# Patient Record
Sex: Male | Born: 1995 | Race: White | Hispanic: No | Marital: Single | Smoking: Current every day smoker
Health system: Southern US, Community
[De-identification: ages and names within clinical notes are randomized; demographics above are authoritative.]

## PROBLEM LIST (undated history)

## (undated) DIAGNOSIS — F988 Other specified behavioral and emotional disorders with onset usually occurring in childhood and adolescence: Secondary | ICD-10-CM

## (undated) HISTORY — DX: Other specified behavioral and emotional disorders with onset usually occurring in childhood and adolescence: F98.8

---
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
## 2014-04-28 ENCOUNTER — Emergency Department (HOSPITAL_COMMUNITY)
Admission: EM | Admit: 2014-04-28 | Discharge: 2014-04-28 | Disposition: A | Discharge status: Home / Self Care | Payer: Managed Care, Other (non HMO) | Attending: Emergency Medicine | Admitting: Emergency Medicine

## 2014-04-28 ENCOUNTER — Encounter (HOSPITAL_COMMUNITY): Payer: Self-pay | Admitting: Emergency Medicine

## 2014-04-28 ENCOUNTER — Ambulatory Visit (HOSPITAL_COMMUNITY): Admission: RE | Admit: 2014-04-28 | Payer: 59 | Source: Home / Self Care | Admitting: Psychiatry

## 2014-04-28 DIAGNOSIS — Z720 Tobacco use: Secondary | ICD-10-CM | POA: Insufficient documentation

## 2014-04-28 DIAGNOSIS — Z008 Encounter for other general examination: Secondary | ICD-10-CM | POA: Diagnosis present

## 2014-04-28 DIAGNOSIS — G479 Sleep disorder, unspecified: Secondary | ICD-10-CM | POA: Diagnosis not present

## 2014-04-28 DIAGNOSIS — F1120 Opioid dependence, uncomplicated: Secondary | ICD-10-CM | POA: Insufficient documentation

## 2014-04-28 LAB — CBC WITH DIFFERENTIAL/PLATELET
Basophils Absolute: 0 10*3/uL (ref 0.0–0.1)
Basophils Relative: 0 % (ref 0–1)
Eosinophils Absolute: 0.1 10*3/uL (ref 0.0–0.7)
Eosinophils Relative: 1 % (ref 0–5)
HCT: 44 % (ref 39.0–52.0)
Hemoglobin: 15.1 g/dL (ref 13.0–17.0)
Lymphocytes Relative: 16 % (ref 12–46)
Lymphs Abs: 2 10*3/uL (ref 0.7–4.0)
MCH: 29.7 pg (ref 26.0–34.0)
MCHC: 34.3 g/dL (ref 30.0–36.0)
MCV: 86.4 fL (ref 78.0–100.0)
Monocytes Absolute: 0.8 10*3/uL (ref 0.1–1.0)
Monocytes Relative: 6 % (ref 3–12)
Neutro Abs: 9.5 10*3/uL — ABNORMAL HIGH (ref 1.7–7.7)
Neutrophils Relative %: 77 % (ref 43–77)
Platelets: 258 10*3/uL (ref 150–400)
RBC: 5.09 MIL/uL (ref 4.22–5.81)
RDW: 12.4 % (ref 11.5–15.5)
WBC: 12.4 10*3/uL — ABNORMAL HIGH (ref 4.0–10.5)

## 2014-04-28 LAB — ETHANOL: Alcohol, Ethyl (B): <11 mg/dL (ref 0–11)

## 2014-04-28 LAB — COMPREHENSIVE METABOLIC PANEL
ALT: 13 U/L (ref 0–53)
AST: 14 U/L (ref 0–37)
Albumin: 4.5 g/dL (ref 3.5–5.2)
Alkaline Phosphatase: 64 U/L (ref 39–117)
Anion gap: 14 (ref 5–15)
BUN: 10 mg/dL (ref 6–23)
CO2: 27 mEq/L (ref 19–32)
Calcium: 9.7 mg/dL (ref 8.4–10.5)
Chloride: 101 mEq/L (ref 96–112)
Creatinine, Ser: 1.01 mg/dL (ref 0.50–1.35)
GFR calc Af Amer: >90 mL/min (ref >90)
GFR calc non Af Amer: >90 mL/min (ref >90)
Glucose, Bld: 104 mg/dL — ABNORMAL HIGH (ref 70–99)
Potassium: 4.4 mEq/L (ref 3.7–5.3)
Sodium: 142 mEq/L (ref 137–147)
Total Bilirubin: 0.6 mg/dL (ref 0.3–1.2)
Total Protein: 7.5 g/dL (ref 6.0–8.3)

## 2014-04-28 LAB — RAPID URINE DRUG SCREEN, HOSP PERFORMED
Amphetamines: NOT DETECTED
Barbiturates: NOT DETECTED
Benzodiazepines: NOT DETECTED
Cocaine: NOT DETECTED
Opiates: NOT DETECTED
Tetrahydrocannabinol: NOT DETECTED

## 2014-04-28 NOTE — ED Notes (Signed)
Per pt/mother binging on opiates since Tuesday, here for medical clearance

## 2014-04-28 NOTE — ED Provider Notes (Signed)
CSN: [id number]     Arrival date & time 04/28/14  1326 History  This chart was scribed for non-physician practitioner working with Dr. Anitra LauthPlunkett by Elveria Risingimelie Horne, ED Scribe. This patient was seen in room WTR4/WLPT4 and the patient's care was started at 2:09 PM.   Chief Complaint  Patient presents with  . opiate addiction    The history is provided by the patient and a parent. No language interpreter was used.   HPI Comments: Elijah [name] is a [age] y.o. male who presents to the Emergency Department seeking help for his opiate addiction. [facility].  Patient reports taking an array of medications including, "DM pills" and five Tramadol three nights ago. Patient denies any desire to harm himself; he was just seeking a high. Patient denies alcohol use. Patient states that he has not used since the incident. Mother is concerned that the patient has not slept since taking the pills.  Patient has never been to rehab. Patient denies psychiatric history.   History reviewed. No pertinent past medical history. History reviewed. No pertinent past surgical history. No family history on file. History  Substance Use Topics  . Smoking status: Current Every Day Smoker  . Smokeless tobacco: Not on file  . Alcohol Use: No    Review of Systems  Constitutional: Negative for fever and chills.  Cardiovascular: Negative for chest pain.  Gastrointestinal: Negative for nausea, vomiting and diarrhea.  Psychiatric/Behavioral: Positive for sleep disturbance. Negative for suicidal ideas and self-injury.    Allergies  Review of patient's allergies indicates not on file.  Home Medications   Prior to Admission medications   Not on File   Triage Vitals: BP 103/73  Pulse 96  Temp(Src) 98.3 F (36.8 C) (Oral)  Resp 16  SpO2 100%  Physical Exam  Nursing note and vitals reviewed. Constitutional: He is oriented to person, place, and time. He appears  well-developed and well-nourished. No distress.  HENT:  Head: Normocephalic and atraumatic.  Eyes: EOM are normal.  Neck: Neck supple.  Cardiovascular: Normal rate.   Pulmonary/Chest: Effort normal. No respiratory distress.  Musculoskeletal: Normal range of motion.  Neurological: He is alert and oriented to person, place, and time.  Skin: Skin is warm and dry.  Psychiatric: He has a normal mood and affect. His speech is normal. He expresses no homicidal and no suicidal ideation.    ED Course  Procedures (including critical care time)  COORDINATION OF CARE: 2:18 PM- Discussed treatment plan with patient at bedside and patient agreed to plan.   Labs Review Labs Reviewed  CBC WITH DIFFERENTIAL - Abnormal; Notable for the following:    WBC 12.4 (*)    Neutro Abs 9.5 (*)    All other components within normal limits  COMPREHENSIVE METABOLIC PANEL - Abnormal; Notable for the following:    Glucose, Bld 104 (*)    All other components within normal limits  URINE RAPID DRUG SCREEN (HOSP PERFORMED)  ETHANOL    Imaging Review No results found.   EKG Interpretation None      MDM   Final diagnoses:  Encounter for medical clearance for patient hold  Uncomplicated opioid dependence   [facility] options.   I personally performed the services described in this documentation, which was scribed in my presence. The recorded information has been reviewed and is accurate.    Junius FinnerErin O'Malley, PA-C 04/28/14 2021

## 2014-04-28 NOTE — Discharge Instructions (Signed)
Finding Treatment for Alcohol and Drug Addiction It can be hard to find the right place to get professional treatment. Here are some important things to consider:  There are different types of treatment to choose from.  Some programs are live-in (residential) while others are not (outpatient). Sometimes a combination is offered.  No single type of program is right for everyone.  Most treatment programs involve a combination of education, counseling, and a 12-step, spiritually-based approach.  There are non-spiritually based programs (not 12-step).  Some treatment programs are government sponsored. They are geared for patients without private insurance.  Treatment programs can vary in many respects such as:  Cost and types of insurance accepted.  Types of on-site medical services offered.  Length of stay, setting, and size.  Overall philosophy of treatment. A person may need specialized treatment or have needs not addressed by all programs. For example, adolescents need treatment appropriate for their age. Other people have secondary disorders that must be managed as well. Secondary conditions can include mental illness, such as depression or diabetes. Often, a period of detoxification from alcohol or drugs is needed. This requires medical supervision and not all programs offer this. THINGS TO CONSIDER WHEN SELECTING A TREATMENT PROGRAM   Is the program certified by the appropriate government agency? Even private programs must be certified and employ certified professionals.  Does the program accept your insurance? If not, can a payment plan be set up?  Is the facility clean, organized, and well run? Do they allow you to speak with graduates who can share their treatment experience with you? Can you tour the facility? Can you meet with staff?  Does the program meet the full range of individual needs?  Does the treatment program address sexual orientation and physical disabilities?  Do they provide age, gender, and culturally appropriate treatment services?  Is treatment available in languages other than English?  Is long-term aftercare support or guidance encouraged and provided?  Is assessment of an individual's treatment plan ongoing to ensure it meets changing needs?  Does the program use strategies to encourage reluctant patients to remain in treatment long enough to increase the likelihood of success?  Does the program offer counseling (individual or group) and other behavioral therapies?  Does the program offer medicine as part of the treatment regimen, if needed?  Is there ongoing monitoring of possible relapse? Is there a defined relapse prevention program? Are services or referrals offered to family members to ensure they understand addiction and the recovery process? This would help them support the recovering individual.  Are 12-step meetings held at the center or is transport available for patients to attend outside meetings? In countries outside of the [place], see local directories for contact information for services in your area. Document Released: 06/06/2005 Document Revised: 09/30/2011 Document Reviewed: 12/17/2007 ExitCare Patient Information 2015 [employer]. This information is not intended to replace advice given to you by your health care provider. Make sure you discuss any questions you have with your health care provider.  

## 2014-04-29 ENCOUNTER — Ambulatory Visit (HOSPITAL_COMMUNITY)
Admission: RE | Admit: 2014-04-29 | Discharge: 2014-04-29 | Disposition: A | Discharge status: Home / Self Care | Payer: Managed Care, Other (non HMO) | Attending: Psychiatry | Admitting: Psychiatry

## 2014-04-29 ENCOUNTER — Encounter (HOSPITAL_COMMUNITY): Payer: Self-pay | Admitting: *Deleted

## 2014-04-29 DIAGNOSIS — Z008 Encounter for other general examination: Secondary | ICD-10-CM | POA: Insufficient documentation

## 2014-04-29 DIAGNOSIS — Z609 Problem related to social environment, unspecified: Secondary | ICD-10-CM | POA: Insufficient documentation

## 2014-04-29 DIAGNOSIS — Z818 Family history of other mental and behavioral disorders: Secondary | ICD-10-CM | POA: Insufficient documentation

## 2014-04-29 DIAGNOSIS — Z569 Unspecified problems related to employment: Secondary | ICD-10-CM | POA: Diagnosis not present

## 2014-04-29 DIAGNOSIS — F331 Major depressive disorder, recurrent, moderate: Secondary | ICD-10-CM | POA: Insufficient documentation

## 2014-04-29 NOTE — ED Provider Notes (Signed)
Medical screening examination/treatment/procedure(s) were performed by non-physician practitioner and as supervising physician I was immediately available for consultation/collaboration.   EKG Interpretation None        Gwyneth SproutWhitney Velmer Woelfel, MD 04/29/14 1247

## 2014-04-29 NOTE — BH Assessment (Signed)
Assessment Note  Elijah [name] W Elijah [name] is an 18 y.o. male that was referred by his mother and counselor Burlene Arnt(Jennifer Bessler) for evaluation.  [facility] with his mother and girlfriend and they were both present for assessment.  Per mother, pt's future counselor, Burlene ArntJennifer Bessler, wanted pt to have medical clearance and an [facility] before pt could be seen by her for counseling to determine appropriate level of care.  [facility] and [facility].  Pt took 32 Triple C pills, 5 Tramadol, and one muscle relaxer Tuesday night and this worried mother, so she called Ms. Bessler once she found out.  Pt stated he took this medicine with his friend at his friend's grandmother's house to "get high," and that the grandmother "watched us so we wouldn't fall asleep," but was not taken to ER.  Pt denies this was an attempt to harm himself, denies SI currently, and denies any self-harm in the past.  Pt denies HI or psychosis.  Pt admits to taking some of his mother's Clonazepam (taking 2 for sleep) and one Trazadone of his mother's for sleep in the past 2 weeks.  Pt stated he takes medication for sleep.  Pt endorses sx of depression and anxiety and reports it is hard to get up in the mornings sometimes.  He works, lives with his mother and feels like a "failure" for not going to college as planned.  Her reports he feels "emotionless" or "apathetic" most of the time and doesn't like feeling this way.  He does admit to feeling "sad" or "down" at times as well.  Pt got in trouble with his friends for breaking and entering and stealing last year and is on probation.  He has a hx of diagnosis of ADD and is prescribed Adderal by his PCP.  He doesn't take this consistently and sometimes reports taking more than prescribed.  Pt is cooperative, has depressed mood, appears anxious, has good eye contact, is oriented x 4, has logical/coherent thought processes  and normal sleech.  Pt doesn't meet criteria for inpatient psychiatric stablization at this time.  Consulted with May Agustin, NP @ 1350 who was in agreement and pt stated he was going to follow up with a counselor and make a psychiatry appt with [facility].  Updated TTS staff and Berneice Heinrichina Tate, [facility].  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 Axis IV: occupational problems, other psychosocial or environmental problems, problems related to legal system/crime and problems with primary support group Axis V: 51-60 moderate symptoms  Past Medical History: No past medical history on file.  No past surgical history on file.  Family History: No family history on file.  Social History:  reports that he has been smoking.  He does not have any smokeless tobacco history on file. He reports that he uses illicit drugs. He reports that he does not drink alcohol.  Additional Social History:  Alcohol / Drug Use Pain Medications: see med list Prescriptions: see med list Over the Counter: see med list History of alcohol / drug use?:  (pt has experimented with several drugs) Longest period of sobriety (when/how long):  (unknown) Withdrawal Symptoms:  (na)  CIWA:   COWS:    Allergies: Allergies not on file  Home Medications:  (Not in a hospital admission)  OB/GYN Status:  No LMP for male patient.  General  Assessment Data [facility] of Assessment: [facility] Services Is this a Tele or Face-to-Face Assessment?: Face-to-Face Is this an Initial Assessment or a Re-assessment for this encounter?: Initial Assessment Living Arrangements: Parent Can pt return to current living arrangement?: No Admission Status: Voluntary Is patient capable of signing voluntary admission?: Yes Transfer from: Home Referral Source: Other  Medical Screening Exam [facility] Walk-in ONLY) Medical Exam completed: No Reason for MSE not completed:  Patient Refused  [facility] Crisis Care Plan Living Arrangements: Parent Name of Psychiatrist: none Name of Therapist: none  Education Status Is patient currently in school?: No Highest grade of school patient has completed: High School graduate  Risk to self with the past 6 months Suicidal Ideation: No Suicidal Intent: No Is patient at risk for suicide?: No Suicidal Plan?: No Access to Means: No What has been your use of drugs/alcohol within the last 12 months?: pt admits to experimental drug use Previous Attempts/Gestures: No How many times?: 0 Other Self Harm Risks: na - pt denies Triggers for Past Attempts: None known Intentional Self Injurious Behavior: None Family Suicide History: Yes (sister attempted suicide) Recent stressful life event(s): Legal Issues;Other (Comment) (SA, legal, depression) Persecutory voices/beliefs?: No Depression: Yes Depression Symptoms: Despondent;Insomnia;Isolating;Fatigue;Guilt;Loss of interest in usual pleasures;Feeling worthless/self pity Substance abuse history and/or treatment for substance abuse?: Yes Suicide prevention information given to non-admitted patients: Not applicable  Risk to Others within the past 6 months Homicidal Ideation: No Thoughts of Harm to Others: No Current Homicidal Intent: No Current Homicidal Plan: No Access to Homicidal Means: No Identified Victim: na - pt denies History of harm to others?: No Assessment of Violence: None Noted Violent Behavior Description: na - pt denies Does patient have access to weapons?: No Criminal Charges Pending?: No Does patient have a court date: No (pt is on probation)  Psychosis Hallucinations: None noted Delusions: None noted  Mental Status Report Appear/Hygiene: Unremarkable (in street clothes, appears stated age) Eye Contact: Good Motor Activity: Restlessness Speech: Logical/coherent Level of Consciousness: Alert Mood: Depressed;Anxious Affect: Depressed;Anxious Anxiety  Level: Moderate Thought Processes: Coherent;Relevant Judgement: Unimpaired Orientation: Person;Place;Time;Situation;Appropriate for developmental age Obsessive Compulsive Thoughts/Behaviors: None  Cognitive Functioning Concentration: Decreased Memory: Recent Intact;Remote Intact IQ: Average Insight: Fair Impulse Control: Poor Appetite: Good Weight Loss: 0 Weight Gain: 0 Sleep: Decreased Total Hours of Sleep:  (varies) Vegetative Symptoms: None  ADLScreening [facility] Assessment Services) Patient's cognitive ability adequate to safely complete daily activities?: Yes Patient able to express need for assistance with ADLs?: Yes Independently performs ADLs?: Yes (appropriate for developmental age)  Prior Inpatient Therapy Prior Inpatient Therapy: No Prior Therapy Dates: na Prior Therapy Facilty/Provider(s): na Reason for Treatment: na  Prior Outpatient Therapy Prior Outpatient Therapy: No Prior Therapy Dates: na Prior Therapy Facilty/Provider(s): na Reason for Treatment: na  ADL Screening (condition at time of admission) Patient's cognitive ability adequate to safely complete daily activities?: Yes Is the patient deaf or have difficulty hearing?: No Does the patient have difficulty seeing, even when wearing glasses/contacts?: No Does the patient have difficulty concentrating, remembering, or making decisions?: No Patient able to express need for assistance with ADLs?: Yes Does the patient have difficulty dressing or bathing?: No Independently performs ADLs?: Yes (appropriate for developmental age) Does the patient have difficulty walking or climbing stairs?: No  Home Assistive Devices/Equipment Home Assistive Devices/Equipment: None    Abuse/Neglect Assessment (Assessment to be complete while patient is alone) Physical Abuse: Denies Verbal Abuse: Denies Sexual Abuse: Denies Exploitation of patient/patient's resources: Denies Self-Neglect: Denies Values /  Beliefs  Cultural Requests During Hospitalization: None Spiritual Requests During Hospitalization: None Consults Spiritual Care Consult Needed: No Social Work Consult Needed: No Merchant navy officerAdvance Directives (For Healthcare) Does patient have an advance directive?: No Would patient like information on creating an advanced directive?: No - patient declined information    Additional Information 1:1 In Past 12 Months?: No CIRT Risk: No Elopement Risk: No Does patient have medical clearance?: No     Disposition:  Disposition Initial Assessment Completed for this Encounter: Yes Disposition of Patient: Referred to;Outpatient treatment Type of outpatient treatment: Adult  On Site Evaluation by:   Reviewed with Physician:    Casimer LaniusKristen Alise Calais, MS, [facility] Licensed Professional Counselor Therapeutic Triage Specialist [facility] Phone: [phone] Fax: [phone]  04/29/2014 2:33 PM

## 2015-05-16 ENCOUNTER — Ambulatory Visit (INDEPENDENT_AMBULATORY_CARE_PROVIDER_SITE_OTHER): Payer: Managed Care, Other (non HMO) | Admitting: Emergency Medicine

## 2015-05-16 ENCOUNTER — Ambulatory Visit: Payer: Self-pay

## 2015-05-16 VITALS — BP 108/68 | HR 72 | Temp 98.5°F | Resp 16 | Ht 71.25 in | Wt 158.4 lb

## 2015-05-16 DIAGNOSIS — Z23 Encounter for immunization: Secondary | ICD-10-CM | POA: Diagnosis not present

## 2015-05-16 NOTE — Progress Notes (Signed)
   Subjective:  Patient ID: Elijah [name], male    DOB: 09/09/1995  Age: 19 y.o. MRN: [id number]  CC: Immunizations   HPI Elijah [name] presents  requesting a tetanus shot. He is concerned about his occupational exposure to infection.  History Elijah [name] has a past medical history of ADD (attention deficit disorder).   He has no past surgical history on file.   His  family history includes Heart disease in his maternal grandfather; Psoriasis in his sister.  He   reports that he has been smoking.  He does not have any smokeless tobacco history on file. He reports that he does not drink alcohol or use illicit drugs.  No outpatient prescriptions prior to visit.   No facility-administered medications prior to visit.    Social History   Social History  . Marital Status: Single    Spouse Name: N/A  . Number of Children: N/A  . Years of Education: N/A   Social History Main Topics  . Smoking status: Current Every Day Smoker  . Smokeless tobacco: None  . Alcohol Use: No  . Drug Use: No  . Sexual Activity: Yes   Other Topics Concern  . None   Social History Narrative     Review of Systems  Constitutional: Negative for fever, chills and appetite change.  HENT: Negative for congestion, ear pain, postnasal drip, sinus pressure and sore throat.   Eyes: Negative for pain and redness.  Respiratory: Negative for cough, shortness of breath and wheezing.   Cardiovascular: Negative for leg swelling.  Gastrointestinal: Negative for nausea, vomiting, abdominal pain, diarrhea, constipation and blood in stool.  Endocrine: Negative for polyuria.  Genitourinary: Negative for dysuria, urgency, frequency and flank pain.  Musculoskeletal: Negative for gait problem.  Skin: Negative for rash.  Neurological: Negative for weakness and headaches.  Psychiatric/Behavioral: Negative for confusion and decreased concentration. The patient is not nervous/anxious.     Objective:  BP 108/68 mmHg   Pulse 72  Temp(Src) 98.5 F (36.9 C) (Oral)  Resp 16  Ht 5' 11.25" (1.81 m)  Wt 158 lb 6.4 oz (71.85 kg)  BMI 21.93 kg/m2  SpO2 99%  Physical Exam  Constitutional: He is oriented to person, place, and time. He appears well-developed and well-nourished.  HENT:  Head: Normocephalic and atraumatic.  Eyes: Conjunctivae are normal. Pupils are equal, round, and reactive to light.  Pulmonary/Chest: Effort normal.  Musculoskeletal: He exhibits no edema.  Neurological: He is alert and oriented to person, place, and time.  Skin: Skin is dry.  Psychiatric: He has a normal mood and affect. His behavior is normal. Thought content normal.      Assessment & Plan:   Elijah [name] was seen today for immunizations.  Diagnoses and all orders for this visit:  Need for Tdap vaccination  Other orders -     Tdap vaccine greater than or equal to 7yo IM   I am having Elijah [name] maintain his amphetamine-dextroamphetamine and amphetamine-dextroamphetamine.  Meds ordered this encounter  Medications  . amphetamine-dextroamphetamine (ADDERALL) 10 MG tablet    Sig: Take 10 mg by mouth PC lunch.  . amphetamine-dextroamphetamine (ADDERALL XR) 30 MG 24 hr capsule    Sig: Take 30 mg by mouth every morning.    Appropriate red flag conditions were discussed with the patient as well as actions that should be taken.  Patient expressed his understanding.  Follow-up: Return if symptoms worsen or fail to improve.  Carmelina DaneAnderson, Chaise Mahabir S, MD

## 2015-05-16 NOTE — Patient Instructions (Signed)
Tdap Vaccine (Tetanus, Diphtheria and Pertussis): What You Need to Know 1. Why get vaccinated? Tetanus, diphtheria and pertussis are very serious diseases. Tdap vaccine can protect us from these diseases. And, Tdap vaccine given to pregnant women can protect newborn babies against pertussis. TETANUS (Lockjaw) is rare in the [place] today. It causes painful muscle tightening and stiffness, usually all over the body.  It can lead to tightening of muscles in the head and neck so you can't open your mouth, swallow, or sometimes even breathe. Tetanus kills about 1 out of 10 people who are infected even after receiving the best medical care. DIPHTHERIA is also rare in the [place] today. It can cause a thick coating to form in the back of the throat.  It can lead to breathing problems, heart failure, paralysis, and death. PERTUSSIS (Whooping Cough) causes severe coughing spells, which can cause difficulty breathing, vomiting and disturbed sleep.  It can also lead to weight loss, incontinence, and rib fractures. Up to 2 in 100 adolescents and 5 in 100 adults with pertussis are hospitalized or have complications, which could include pneumonia or death. These diseases are caused by bacteria. Diphtheria and pertussis are spread from person to person through secretions from coughing or sneezing. Tetanus enters the body through cuts, scratches, or wounds. Before vaccines, as many as 200,000 cases of diphtheria, 200,000 cases of pertussis, and hundreds of cases of tetanus, were reported in the [place] each year. Since vaccination began, reports of cases for tetanus and diphtheria have dropped by about 99% and for pertussis by about 80%. 2. Tdap vaccine Tdap vaccine can protect adolescents and adults from tetanus, diphtheria, and pertussis. One dose of Tdap is routinely given at age 11 or 12. People who did not get Tdap at that age should get it as soon as possible. Tdap is especially important  for healthcare professionals and anyone having close contact with a baby younger than 12 months. Pregnant women should get a dose of Tdap during every pregnancy, to protect the newborn from pertussis. Infants are most at risk for severe, life-threatening complications from pertussis. Another vaccine, called Td, protects against tetanus and diphtheria, but not pertussis. A Td booster should be given every 10 years. Tdap may be given as one of these boosters if you have never gotten Tdap before. Tdap may also be given after a severe cut or burn to prevent tetanus infection. Your doctor or the person giving you the vaccine can give you more information. Tdap may safely be given at the same time as other vaccines. 3. Some people should not get this vaccine  A person who has ever had a life-threatening allergic reaction after a previous dose of any diphtheria, tetanus or pertussis containing vaccine, OR has a severe allergy to any part of this vaccine, should not get Tdap vaccine. Tell the person giving the vaccine about any severe allergies.  Anyone who had coma or long repeated seizures within 7 days after a childhood dose of DTP or DTaP, or a previous dose of Tdap, should not get Tdap, unless a cause other than the vaccine was found. They can still get Td.  Talk to your doctor if you:  have seizures or another nervous system problem,  had severe pain or swelling after any vaccine containing diphtheria, tetanus or pertussis,  ever had a condition called Guillain-Barr Syndrome (GBS),  aren't feeling well on the day the shot is scheduled. 4. Risks With any medicine, including vaccines, there is   a chance of side effects. These are usually mild and go away on their own. Serious reactions are also possible but are rare. Most people who get Tdap vaccine do not have any problems with it. Mild problems following Tdap (Did not interfere with activities)  Pain where the shot was given (about 3 in 4  adolescents or 2 in 3 adults)  Redness or swelling where the shot was given (about 1 person in 5)  Mild fever of at least 100.4F (up to about 1 in 25 adolescents or 1 in 100 adults)  Headache (about 3 or 4 people in 10)  Tiredness (about 1 person in 3 or 4)  Nausea, vomiting, diarrhea, stomach ache (up to 1 in 4 adolescents or 1 in 10 adults)  Chills, sore joints (about 1 person in 10)  Body aches (about 1 person in 3 or 4)  Rash, swollen glands (uncommon) Moderate problems following Tdap (Interfered with activities, but did not require medical attention)  Pain where the shot was given (up to 1 in 5 or 6)  Redness or swelling where the shot was given (up to about 1 in 16 adolescents or 1 in 12 adults)  Fever over 102F (about 1 in 100 adolescents or 1 in 250 adults)  Headache (about 1 in 7 adolescents or 1 in 10 adults)  Nausea, vomiting, diarrhea, stomach ache (up to 1 or 3 people in 100)  Swelling of the entire arm where the shot was given (up to about 1 in 500). Severe problems following Tdap (Unable to perform usual activities; required medical attention)  Swelling, severe pain, bleeding and redness in the arm where the shot was given (rare). Problems that could happen after any vaccine:  People sometimes faint after a medical procedure, including vaccination. Sitting or lying down for about 15 minutes can help prevent fainting, and injuries caused by a fall. Tell your doctor if you feel dizzy, or have vision changes or ringing in the ears.  Some people get severe pain in the shoulder and have difficulty moving the arm where a shot was given. This happens very rarely.  Any medication can cause a severe allergic reaction. Such reactions from a vaccine are very rare, estimated at fewer than 1 in a million doses, and would happen within a few minutes to a few hours after the vaccination. As with any medicine, there is a very remote chance of a vaccine causing a serious  injury or death. The safety of vaccines is always being monitored. For more information, visit: www.cdc.gov/vaccinesafety/ 5. What if there is a serious problem? What should I look for?  Look for anything that concerns you, such as signs of a severe allergic reaction, very high fever, or unusual behavior.  Signs of a severe allergic reaction can include hives, swelling of the face and throat, difficulty breathing, a fast heartbeat, dizziness, and weakness. These would usually start a few minutes to a few hours after the vaccination. What should I do?  If you think it is a severe allergic reaction or other emergency that can't wait, call 9-1-1 or get the person to the nearest hospital. Otherwise, call your doctor.  Afterward, the reaction should be reported to the Vaccine Adverse Event Reporting System (VAERS). Your doctor might file this report, or you can do it yourself through the VAERS web site at www.vaers.hhs.gov, or by calling 1-800-822-7967. VAERS does not give medical advice.  6. The National Vaccine Injury Compensation Program The National Vaccine Injury Compensation Program (  VICP) is a federal program that was created to compensate people who may have been injured by certain vaccines. Persons who believe they may have been injured by a vaccine can learn about the program and about filing a claim by calling 1-800-338-2382 or visiting the VICP website at www.hrsa.gov/vaccinecompensation. There is a time limit to file a claim for compensation. 7. How can I learn more?  Ask your doctor. He or she can give you the vaccine package insert or suggest other sources of information.  Call your local or state health department.  Contact the Centers for Disease Control and Prevention (CDC):  Call 1-800-232-4636 (1-800-CDC-INFO) or  Visit CDC's website at www.cdc.gov/vaccines CDC Tdap Vaccine VIS (09/14/13)   This information is not intended to replace advice given to you by your health care  provider. Make sure you discuss any questions you have with your health care provider.   Document Released: 01/07/2012 Document Revised: 07/29/2014 Document Reviewed: 10/20/2013 Elsevier Interactive Patient Education 2016 Elsevier Inc.  

## 2020-09-10 ENCOUNTER — Other Ambulatory Visit: Payer: Self-pay

## 2020-09-10 ENCOUNTER — Emergency Department (HOSPITAL_COMMUNITY): Payer: 59

## 2020-09-10 ENCOUNTER — Inpatient Hospital Stay (HOSPITAL_COMMUNITY)
Admission: EM | Admit: 2020-09-10 | Discharge: 2020-09-13 | DRG: 082 | Disposition: A | Discharge status: Rehabilitation Facility | Payer: 59 | Attending: General Surgery | Admitting: General Surgery

## 2020-09-10 DIAGNOSIS — Y908 Blood alcohol level of 240 mg/100 ml or more: Secondary | ICD-10-CM | POA: Diagnosis present

## 2020-09-10 DIAGNOSIS — R402142 Coma scale, eyes open, spontaneous, at arrival to emergency department: Secondary | ICD-10-CM | POA: Diagnosis present

## 2020-09-10 DIAGNOSIS — T1490XA Injury, unspecified, initial encounter: Secondary | ICD-10-CM

## 2020-09-10 DIAGNOSIS — S43102A Unspecified dislocation of left acromioclavicular joint, initial encounter: Secondary | ICD-10-CM | POA: Diagnosis present

## 2020-09-10 DIAGNOSIS — S0219XB Other fracture of base of skull, initial encounter for open fracture: Secondary | ICD-10-CM | POA: Diagnosis not present

## 2020-09-10 DIAGNOSIS — S066X9A Traumatic subarachnoid hemorrhage with loss of consciousness of unspecified duration, initial encounter: Secondary | ICD-10-CM | POA: Diagnosis present

## 2020-09-10 DIAGNOSIS — R402362 Coma scale, best motor response, obeys commands, at arrival to emergency department: Secondary | ICD-10-CM | POA: Diagnosis present

## 2020-09-10 DIAGNOSIS — U071 COVID-19: Secondary | ICD-10-CM | POA: Diagnosis present

## 2020-09-10 DIAGNOSIS — F10929 Alcohol use, unspecified with intoxication, unspecified: Secondary | ICD-10-CM

## 2020-09-10 DIAGNOSIS — S0219XA Other fracture of base of skull, initial encounter for closed fracture: Secondary | ICD-10-CM | POA: Diagnosis present

## 2020-09-10 DIAGNOSIS — S065X9A Traumatic subdural hemorrhage with loss of consciousness of unspecified duration, initial encounter: Secondary | ICD-10-CM | POA: Diagnosis present

## 2020-09-10 DIAGNOSIS — R402252 Coma scale, best verbal response, oriented, at arrival to emergency department: Secondary | ICD-10-CM | POA: Diagnosis present

## 2020-09-10 DIAGNOSIS — I6200 Nontraumatic subdural hemorrhage, unspecified: Secondary | ICD-10-CM

## 2020-09-10 DIAGNOSIS — F10129 Alcohol abuse with intoxication, unspecified: Secondary | ICD-10-CM | POA: Diagnosis present

## 2020-09-10 DIAGNOSIS — S020XXA Fracture of vault of skull, initial encounter for closed fracture: Secondary | ICD-10-CM | POA: Diagnosis present

## 2020-09-10 DIAGNOSIS — I619 Nontraumatic intracerebral hemorrhage, unspecified: Secondary | ICD-10-CM | POA: Diagnosis present

## 2020-09-10 DIAGNOSIS — W109XXA Fall (on) (from) unspecified stairs and steps, initial encounter: Secondary | ICD-10-CM | POA: Diagnosis present

## 2020-09-10 DIAGNOSIS — R52 Pain, unspecified: Secondary | ICD-10-CM

## 2020-09-10 LAB — COMPREHENSIVE METABOLIC PANEL
ALT: 18 U/L (ref 0–44)
AST: 23 U/L (ref 15–41)
Albumin: 3.8 g/dL (ref 3.5–5.0)
Alkaline Phosphatase: 52 U/L (ref 38–126)
Anion gap: 10 (ref 5–15)
BUN: 9 mg/dL (ref 6–20)
CO2: 22 mmol/L (ref 22–32)
Calcium: 8.6 mg/dL — ABNORMAL LOW (ref 8.9–10.3)
Chloride: 104 mmol/L (ref 98–111)
Creatinine, Ser: 0.92 mg/dL (ref 0.61–1.24)
GFR, Estimated: >60 mL/min (ref >60)
Glucose, Bld: 112 mg/dL — ABNORMAL HIGH (ref 70–99)
Potassium: 3.5 mmol/L (ref 3.5–5.1)
Sodium: 136 mmol/L (ref 135–145)
Total Bilirubin: 0.6 mg/dL (ref 0.3–1.2)
Total Protein: 6.7 g/dL (ref 6.5–8.1)

## 2020-09-10 LAB — RAPID URINE DRUG SCREEN, HOSP PERFORMED
Amphetamines: NOT DETECTED
Barbiturates: NOT DETECTED
Benzodiazepines: NOT DETECTED
Cocaine: NOT DETECTED
Opiates: NOT DETECTED
Tetrahydrocannabinol: POSITIVE — AB

## 2020-09-10 LAB — URINALYSIS, ROUTINE W REFLEX MICROSCOPIC
Bilirubin Urine: NEGATIVE
Glucose, UA: NEGATIVE mg/dL
Hgb urine dipstick: NEGATIVE
Ketones, ur: NEGATIVE mg/dL
Leukocytes,Ua: NEGATIVE
Nitrite: NEGATIVE
Protein, ur: NEGATIVE mg/dL
Specific Gravity, Urine: 1.012 (ref 1.005–1.030)
pH: 6 (ref 5.0–8.0)

## 2020-09-10 LAB — RESP PANEL BY RT-PCR (FLU A&B, COVID) ARPGX2
Influenza A by PCR: NEGATIVE
Influenza B by PCR: NEGATIVE
SARS Coronavirus 2 by RT PCR: POSITIVE — AB

## 2020-09-10 LAB — I-STAT CHEM 8, ED
BUN: 9 mg/dL (ref 6–20)
Calcium, Ion: 0.99 mmol/L — ABNORMAL LOW (ref 1.15–1.40)
Chloride: 104 mmol/L (ref 98–111)
Creatinine, Ser: 1.3 mg/dL — ABNORMAL HIGH (ref 0.61–1.24)
Glucose, Bld: 112 mg/dL — ABNORMAL HIGH (ref 70–99)
HCT: 43 % (ref 39.0–52.0)
Hemoglobin: 14.6 g/dL (ref 13.0–17.0)
Potassium: 3.6 mmol/L (ref 3.5–5.1)
Sodium: 139 mmol/L (ref 135–145)
TCO2: 22 mmol/L (ref 22–32)

## 2020-09-10 LAB — CBC
HCT: 43.9 % (ref 39.0–52.0)
Hemoglobin: 14.1 g/dL (ref 13.0–17.0)
MCH: 29.9 pg (ref 26.0–34.0)
MCHC: 32.1 g/dL (ref 30.0–36.0)
MCV: 93 fL (ref 80.0–100.0)
Platelets: 325 10*3/uL (ref 150–400)
RBC: 4.72 MIL/uL (ref 4.22–5.81)
RDW: 11.9 % (ref 11.5–15.5)
WBC: 12.5 10*3/uL — ABNORMAL HIGH (ref 4.0–10.5)
nRBC: 0 % (ref 0.0–0.2)

## 2020-09-10 LAB — MRSA PCR SCREENING: MRSA by PCR: NEGATIVE

## 2020-09-10 LAB — MAGNESIUM: Magnesium: 2.2 mg/dL (ref 1.7–2.4)

## 2020-09-10 LAB — PROTIME-INR
INR: 0.9 (ref 0.8–1.2)
Prothrombin Time: 12.1 seconds (ref 11.4–15.2)

## 2020-09-10 LAB — SAMPLE TO BLOOD BANK

## 2020-09-10 LAB — ETHANOL: Alcohol, Ethyl (B): 343 mg/dL (ref <10)

## 2020-09-10 LAB — PHOSPHORUS: Phosphorus: 3.1 mg/dL (ref 2.5–4.6)

## 2020-09-10 LAB — LACTIC ACID, PLASMA: Lactic Acid, Venous: 2.3 mmol/L (ref 0.5–1.9)

## 2020-09-10 LAB — HIV ANTIBODY (ROUTINE TESTING W REFLEX): HIV Screen 4th Generation wRfx: NONREACTIVE

## 2020-09-10 MED ORDER — IOHEXOL 300 MG/ML  SOLN
100.0000 mL | Freq: Once | INTRAMUSCULAR | Status: AC | PRN
  Administered 2020-09-10: 100 mL via INTRAVENOUS

## 2020-09-10 MED ORDER — FOLIC ACID 1 MG PO TABS
1.0000 mg | ORAL_TABLET | Freq: Every day | ORAL | Status: DC
  Administered 2020-09-11 – 2020-09-13 (×3): 1 mg via ORAL
  Filled 2020-09-10 (×3): qty 1

## 2020-09-10 MED ORDER — ONDANSETRON HCL 4 MG/2ML IJ SOLN: 4.0000 mg | Freq: Four times a day (QID) | INTRAMUSCULAR | Status: DC | PRN

## 2020-09-10 MED ORDER — LORAZEPAM 2 MG/ML IJ SOLN
INTRAMUSCULAR | Status: AC
  Administered 2020-09-10: 2 mg via INTRAVENOUS
  Filled 2020-09-10: qty 1

## 2020-09-10 MED ORDER — LORAZEPAM 2 MG/ML IJ SOLN: 2.0000 mg | Freq: Once | INTRAMUSCULAR | Status: AC

## 2020-09-10 MED ORDER — LORAZEPAM 2 MG/ML IJ SOLN
1.0000 mg | INTRAMUSCULAR | Status: DC | PRN
  Administered 2020-09-10: 4 mg via INTRAVENOUS
  Administered 2020-09-11: 2 mg via INTRAVENOUS
  Filled 2020-09-10 (×3): qty 2

## 2020-09-10 MED ORDER — OXYCODONE HCL 5 MG PO TABS
5.0000 mg | ORAL_TABLET | ORAL | Status: DC | PRN
  Administered 2020-09-13: 5 mg via ORAL
  Filled 2020-09-10: qty 2

## 2020-09-10 MED ORDER — ONDANSETRON 4 MG PO TBDP: 4.0000 mg | ORAL_TABLET | Freq: Four times a day (QID) | ORAL | Status: DC | PRN

## 2020-09-10 MED ORDER — LEVETIRACETAM IN NACL 500 MG/100ML IV SOLN
500.0000 mg | Freq: Two times a day (BID) | INTRAVENOUS | Status: DC
Start: 2020-09-10 — End: 2020-09-14
  Administered 2020-09-10 – 2020-09-13 (×6): 500 mg via INTRAVENOUS
  Filled 2020-09-10 (×8): qty 100

## 2020-09-10 MED ORDER — ACETAMINOPHEN 10 MG/ML IV SOLN
1000.0000 mg | Freq: Four times a day (QID) | INTRAVENOUS | Status: AC
  Administered 2020-09-10 – 2020-09-11 (×4): 1000 mg via INTRAVENOUS
  Filled 2020-09-10 (×6): qty 100

## 2020-09-10 MED ORDER — MORPHINE SULFATE (PF) 4 MG/ML IV SOLN: 4.0000 mg | INTRAVENOUS | Status: DC | PRN

## 2020-09-10 MED ORDER — DEXMEDETOMIDINE HCL IN NACL 400 MCG/100ML IV SOLN
0.4000 ug/kg/h | INTRAVENOUS | Status: DC
  Administered 2020-09-10 – 2020-09-11 (×2): 0.4 ug/kg/h via INTRAVENOUS
  Administered 2020-09-11: 0.5 ug/kg/h via INTRAVENOUS
  Filled 2020-09-10 (×3): qty 100

## 2020-09-10 MED ORDER — DOCUSATE SODIUM 100 MG PO CAPS
100.0000 mg | ORAL_CAPSULE | Freq: Two times a day (BID) | ORAL | Status: DC
  Administered 2020-09-12 – 2020-09-13 (×3): 100 mg via ORAL
  Filled 2020-09-10 (×3): qty 1

## 2020-09-10 MED ORDER — THIAMINE HCL 100 MG/ML IJ SOLN
100.0000 mg | Freq: Every day | INTRAMUSCULAR | Status: DC
  Administered 2020-09-10 – 2020-09-11 (×2): 100 mg via INTRAVENOUS
  Filled 2020-09-10 (×2): qty 2

## 2020-09-10 MED ORDER — METHOCARBAMOL 1000 MG/10ML IJ SOLN
1000.0000 mg | Freq: Three times a day (TID) | INTRAVENOUS | Status: DC
  Administered 2020-09-10 – 2020-09-13 (×8): 1000 mg via INTRAVENOUS
  Filled 2020-09-10 (×15): qty 10

## 2020-09-10 MED ORDER — THIAMINE HCL 100 MG PO TABS
100.0000 mg | ORAL_TABLET | Freq: Every day | ORAL | Status: DC
  Administered 2020-09-12 – 2020-09-13 (×2): 100 mg via ORAL
  Filled 2020-09-10 (×2): qty 1

## 2020-09-10 MED ORDER — SPIRITUS FRUMENTI
1.0000 | Freq: Three times a day (TID) | ORAL | Status: DC
  Administered 2020-09-11 – 2020-09-12 (×2): 1 via ORAL
  Filled 2020-09-10 (×11): qty 1

## 2020-09-10 MED ORDER — LORAZEPAM 1 MG PO TABS: 1.0000 mg | ORAL_TABLET | ORAL | Status: DC | PRN

## 2020-09-10 MED ORDER — ADULT MULTIVITAMIN W/MINERALS CH
1.0000 | ORAL_TABLET | Freq: Every day | ORAL | Status: DC
  Administered 2020-09-11 – 2020-09-13 (×3): 1 via ORAL
  Filled 2020-09-10 (×3): qty 1

## 2020-09-10 MED ORDER — SODIUM CHLORIDE 0.9 % IV SOLN: INTRAVENOUS | Status: DC

## 2020-09-10 NOTE — H&P (Signed)
   Activation and Reason: level I, fall down stairs  Primary Survey: airway intact, breat sounds present bilaterally, distal pulses intact  Elijah [name] is an 25 y.o. male.  HPI: 25 yo male found at bottom of stairs. Room mate notes alcohol use. Nonverbal during transport. Not answering questions.  No past medical history on file.  No family history on file.  Social History:  has no history on file for tobacco use, alcohol use, and drug use.  Allergies: No Known Allergies  Medications: I have reviewed the patient's current medications.  Results for orders placed or performed during the hospital encounter of 09/10/20 (from the past 48 hour(s))  I-Stat Chem 8, ED     Status: Abnormal   Collection Time: 09/10/20  6:13 PM  Result Value Ref Range   Sodium 139 135 - 145 mmol/L   Potassium 3.6 3.5 - 5.1 mmol/L   Chloride 104 98 - 111 mmol/L   BUN 9 6 - 20 mg/dL   Creatinine, Ser 1.95 (H) 0.61 - 1.24 mg/dL   Glucose, Bld 093 (H) 70 - 99 mg/dL    Comment: Glucose reference range applies only to samples taken after fasting for at least 8 hours.   Calcium, Ion 0.99 (L) 1.15 - 1.40 mmol/L   TCO2 22 22 - 32 mmol/L   Hemoglobin 14.6 13.0 - 17.0 g/dL   HCT 26.7 12.4 - 58.0 %    No results found.  Review of Systems  Unable to perform ROS: Acuity of condition    PE Blood pressure 138/80, pulse 74, temperature (!) 97 F (36.1 C), temperature source Temporal, resp. rate 12, height 6' (1.829 m), weight 88.5 kg, SpO2 98 %. Constitutional: somnolent but responds to noxious stimuli; no deformities Eyes: Moist conjunctiva; no lid lag; anicteric; PERRL Neck: Trachea midline; no thyromegaly, no step offs Lungs: Normal respiratory effort; no tactile fremitus CV: RRR; no palpable thrills; no pitting edema GI: Abd nontender; no palpable hepatosplenomegaly MSK: unable to assess gait; no clubbing/cyanosis Psychiatric: combative when awake Neuro: M5 V4 E2, moves all extremities with good  strength Lymphatic: No palpable cervical or axillary lymphadenopathy   Assessment/Plan: 25 yo male fall down stairs with disability  Procedures: none  De Blanch Manus Weedman 09/10/2020, 6:28 PM

## 2020-09-10 NOTE — Plan of Care (Signed)
  Problem: Safety: Goal: Non-violent Restraint(s) Outcome: Progressing   Problem: Clinical Measurements: Goal: Ability to maintain clinical measurements within normal limits will improve Outcome: Progressing Goal: Will remain free from infection Outcome: Progressing Goal: Diagnostic test results will improve Outcome: Progressing Goal: Respiratory complications will improve Outcome: Progressing Goal: Cardiovascular complication will be avoided Outcome: Progressing   

## 2020-09-10 NOTE — ED Provider Notes (Signed)
[facility] EMERGENCY DEPARTMENT Provider Note   CSN: [id number] Arrival date & time: 09/10/20  1800     History No chief complaint on file.   Elijah [name] is a 25 y.o. male.  Presents in a trauma level 1 activation.  History obtained from EMS.  Patient reported at home the other individuals heard a loud crash and found the patient at the bottom of about a 10-12 step stairway.  They saw bleeding from his left ear and EMS was contacted.  Patient unresponsive on scene.        No past medical history on file.  Patient Active Problem List   Diagnosis Date Noted  . ICH (intracerebral hemorrhage) (HCC) 09/10/2020         No family history on file.     Home Medications Prior to Admission medications   Not on File    Allergies    Patient has no known allergies.  Review of Systems   Review of Systems  Unable to perform ROS: Acuity of condition    Physical Exam Updated Vital Signs BP 130/86   Pulse 66   Temp (!) 97 F (36.1 C) (Temporal)   Resp 17   Ht 6' (1.829 m)   Wt 88.5 kg   SpO2 100%   BMI 26.45 kg/m   Physical Exam Constitutional:      Comments: Moderately obtunded  HENT:     Head:     Comments: No facial or skull deformity noted.  Left ear has positive blood coming from the ear.    Nose: Nose normal.  Eyes:     Comments: 4-3 bilaterally and sluggish.  Cardiovascular:     Rate and Rhythm: Normal rate.     Pulses: Normal pulses.  Pulmonary:     Effort: Pulmonary effort is normal.  Abdominal:     General: Abdomen is flat.  Musculoskeletal:     Cervical back: Neck supple.     Comments: No obvious deformity or laceration to bilateral upper and bilateral lower extremities.  Pelvis otherwise stable.  No C/C/L-spine step-offs noted.  Neurological:     Comments: Patient arrives eyes closed, no response to verbal stimuli.  Patient opens his eyes and has purposeful movement to painful stimuli.  Appears to be moving all  extremities.  Normal rectal tone.     ED Results / Procedures / Treatments   Labs (all labs ordered are listed, but only abnormal results are displayed) Labs Reviewed  RESP PANEL BY RT-PCR (FLU A&B, COVID) ARPGX2 - Abnormal; Notable for the following components:      Result Value   SARS Coronavirus 2 by RT PCR POSITIVE (*)    All other components within normal limits  COMPREHENSIVE METABOLIC PANEL - Abnormal; Notable for the following components:   Glucose, Bld 112 (*)    Calcium 8.6 (*)    All other components within normal limits  CBC - Abnormal; Notable for the following components:   WBC 12.5 (*)    All other components within normal limits  ETHANOL - Abnormal; Notable for the following components:   Alcohol, Ethyl (B) 343 (*)    All other components within normal limits  URINALYSIS, ROUTINE W REFLEX MICROSCOPIC - Abnormal; Notable for the following components:   Color, Urine STRAW (*)    All other components within normal limits  LACTIC ACID, PLASMA - Abnormal; Notable for the following components:   Lactic Acid, Venous 2.3 (*)    All other  components within normal limits  I-STAT CHEM 8, ED - Abnormal; Notable for the following components:   Creatinine, Ser 1.30 (*)    Glucose, Bld 112 (*)    Calcium, Ion 0.99 (*)    All other components within normal limits  PROTIME-INR  RAPID URINE DRUG SCREEN, HOSP PERFORMED  HIV ANTIBODY (ROUTINE TESTING W REFLEX)  CBC  BASIC METABOLIC PANEL  COMPREHENSIVE METABOLIC PANEL  MAGNESIUM  PHOSPHORUS  CBC  SAMPLE TO BLOOD BANK    EKG None  Radiology CT Head Wo Contrast  Result Date: 09/10/2020 CLINICAL DATA:  Multi trauma EXAM: CT HEAD WITHOUT CONTRAST TECHNIQUE: Contiguous axial images were obtained from the base of the skull through the vertex without intravenous contrast. COMPARISON:  None. FINDINGS: Brain: There is traumatic subarachnoid hemorrhage on both sides, more voluminous on the right than the left. No definite  intraparenchymal hematoma. One could question early evidence of a minimal hemorrhagic contusion of the inferior right temporal lobe just superior to the temporal bone. No large intraparenchymal hematoma. There is a thin subdural hematoma along the lateral aspect of the middle cranial fossa on the left. I do not see a finding to in bow the potential of epidural hematoma. No sign of ischemic infarction. No hydrocephalus. Small amount of pneumocephalus on the left associated with fractures described below. Vascular: No primary vascular lesion. Skull: Multiple fractures of the petrous and squamous temporal bones on the left. Longitudinal fracture of the left temporal bone traversing the middle ear. Definite ossicular disruption is not demonstrated. Fluid in the middle ear and mastoid air cells. No definite otic capsule breach. Consider detailed temporal bone study. Fracture lines do extend to the carotid canal. Consider CT angiography. Fractures of the squamous portion of the temporal bone include a minimally displaced fragment. Nondepressed fracture of the inferior left parietal bone. Sinuses/Orbits: Frontal, ethmoid and maxillary sinuses are clear. Air-fluid level in the sphenoid sinus related to a minimal fracture at the left lateral wall and floor. Other: None IMPRESSION: 1. Traumatic subarachnoid hemorrhage on both sides, more voluminous on the right than the left. No definite intraparenchymal hematoma. One could question early evidence of a minimal hemorrhagic contusion of the inferior right temporal lobe just superior to the temporal bone. 2. Thin subdural hematoma along the lateral aspect of the middle cranial fossa on the left. No mass effect upon the brain. 3. Multiple fractures of the petrous and squamous temporal bones on the left. Longitudinal fracture of the left temporal bone traversing the middle ear. Definite ossicular disruption is not demonstrated. Fluid in the middle ear and mastoid air cells. No  definite otic capsule breach. Consider detailed temporal bone study. Fracture lines extend to the carotid canal. Consider CT angiography. 4. Nondepressed fracture of the inferior left parietal bone. 5. Air-fluid level in the sphenoid sinus related to a minimal fracture at the left lateral wall and floor. Electronically Signed   By: Paulina FusiMark  Shogry M.D.   On: 09/10/2020 19:02   CT Cervical Spine Wo Contrast  Result Date: 09/10/2020 CLINICAL DATA:  Multi trauma.  Attended. EXAM: CT CERVICAL SPINE WITHOUT CONTRAST TECHNIQUE: Multidetector CT imaging of the cervical spine was performed without intravenous contrast. Multiplanar CT image reconstructions were also generated. COMPARISON:  None. FINDINGS: Alignment: Normal Skull base and vertebrae: No evidence of cervical spine fracture. See results of head CT for multiple fractures. Soft tissues and spinal canal: Neck soft tissues are negative. Disc levels: Very minimal degenerative spondylosis C4-5 and C5-6. No stenosis. Upper chest:  Clear Other: None IMPRESSION: No traumatic cervical finding. Very minimal degenerative spondylosis C4-5 and C5-6. See results of head CT for multiple fractures described in that report. Electronically Signed   By: Paulina Fusi M.D.   On: 09/10/2020 19:04   DG Pelvis Portable  Result Date: 09/10/2020 CLINICAL DATA:  Pain status post fall EXAM: PORTABLE PELVIS 1-2 VIEWS COMPARISON:  None. FINDINGS: There is no evidence of pelvic fracture or diastasis. No pelvic bone lesions are seen. IMPRESSION: Negative. Electronically Signed   By: Katherine Mantle M.D.   On: 09/10/2020 18:26   CT CHEST ABDOMEN PELVIS W CONTRAST  Result Date: 09/10/2020 CLINICAL DATA:  Multi trauma EXAM: CT CHEST, ABDOMEN, AND PELVIS WITH CONTRAST TECHNIQUE: Multidetector CT imaging of the chest, abdomen and pelvis was performed following the standard protocol during bolus administration of intravenous contrast. CONTRAST:  OMNIPAQUE IOHEXOL 300 MG/ML  SOLN  COMPARISON:  None. FINDINGS: CT CHEST FINDINGS Cardiovascular: Heart size is normal. No pericardial fluid. No evidence of vascular injury in the chest. Mediastinum/Nodes: No evidence of mediastinal hemorrhage. No mass or adenopathy. Lungs/Pleura: No pneumothorax or hemothorax. Lungs are clear except for a very tiny emphysematous bleb at the right apex. No evidence of contusion or aspiration. Musculoskeletal: No fracture of the thoracic spine, ribs or sternum. CT ABDOMEN PELVIS FINDINGS Hepatobiliary: Normal Pancreas: Normal Spleen: Normal Adrenals/Urinary Tract: Adrenal glands are normal. Kidneys are normal. Bladder is normal. Stomach/Bowel: Normal Vascular/Lymphatic: Normal Reproductive: Normal Other: No free fluid or air. Musculoskeletal: No lumbar spine, pelvic or hip fracture. IMPRESSION: Normal CT scan of the chest, abdomen and pelvis. No traumatic finding. Electronically Signed   By: Paulina Fusi M.D.   On: 09/10/2020 19:08   DG Chest Port 1 View  Result Date: 09/10/2020 CLINICAL DATA:  Pain status post fall EXAM: PORTABLE CHEST 1 VIEW COMPARISON:  None. FINDINGS: The heart size and mediastinal contours are within normal limits. Both lungs are clear. The visualized skeletal structures are unremarkable. IMPRESSION: No active disease. Electronically Signed   By: Katherine Mantle M.D.   On: 09/10/2020 18:28    Procedures .Critical Care Performed by: Cheryll Cockayne, MD Authorized by: Cheryll Cockayne, MD   Critical care provider statement:    Critical care time (minutes):  30   Critical care time was exclusive of:  Separately billable procedures and treating other patients and teaching time   Critical care was necessary to treat or prevent imminent or life-threatening deterioration of the following conditions:  Trauma     Medications Ordered in ED Medications  oxyCODONE (Oxy IR/ROXICODONE) immediate release tablet 5-10 mg (has no administration in time range)  morphine 4 MG/ML injection 4 mg  (has no administration in time range)  docusate sodium (COLACE) capsule 100 mg (has no administration in time range)  ondansetron (ZOFRAN-ODT) disintegrating tablet 4 mg (has no administration in time range)    Or  ondansetron (ZOFRAN) injection 4 mg (has no administration in time range)  0.9 %  sodium chloride infusion (has no administration in time range)  levETIRAcetam (KEPPRA) IVPB 500 mg/100 mL premix (has no administration in time range)  methocarbamol (ROBAXIN) 1,000 mg in dextrose 5 % 100 mL IVPB (has no administration in time range)  acetaminophen (OFIRMEV) IV 1,000 mg (has no administration in time range)  LORazepam (ATIVAN) tablet 1-4 mg (has no administration in time range)    Or  LORazepam (ATIVAN) injection 1-4 mg (has no administration in time range)  thiamine tablet 100 mg (has no  administration in time range)    Or  thiamine (B-1) injection 100 mg (has no administration in time range)  folic acid (FOLVITE) tablet 1 mg (has no administration in time range)  multivitamin with minerals tablet 1 tablet (has no administration in time range)  spiritus frumenti (ethyl alcohol) solution 1 each (has no administration in time range)  iohexol (OMNIPAQUE) 300 MG/ML solution 100 mL (100 mLs Intravenous Contrast Given 09/10/20 1820)  LORazepam (ATIVAN) injection 2 mg (2 mg Intravenous Given 09/10/20 1844)    ED Course  I have reviewed the triage vital signs and the nursing notes.  Pertinent labs & imaging results that were available during my care of the patient were reviewed by me and considered in my medical decision making (see chart for details).    MDM Rules/Calculators/A&P                          Patient presented as a level 1 trauma activation.  Bedside fast performed by myself was negative.  CT scan concerning for intracranial hemorrhage and skull fractures.  Admitted to the ICU.   Final Clinical Impression(s) / ED Diagnoses Final diagnoses:  Trauma  Subdural  hemorrhage (HCC)  Alcoholic intoxication with complication [facility])    Rx / DC Orders ED Discharge Orders    None       Cheryll Cockayne, MD 09/10/20 [phone]

## 2020-09-10 NOTE — Progress Notes (Addendum)
   09/10/20 1743  Clinical Encounter Type  Visited With Patient not available  Visit Type ED;Trauma  Referral From Nurse  Consult/Referral To Chaplain   Chaplain responded to Level 1 trauma. Pt unavailable and no family present. Chaplain remains available.  This note was prepared by Chaplain Resident, Tacy Learn, MDiv. Chaplain remains available as needed through the on-call pager: [phone].

## 2020-09-10 NOTE — ED Triage Notes (Signed)
Pt via EMS from home with his roommate who heard a loud noise an then found him at the bottom of 10-12 stairs. Pt has lac t oL head, bleeding from his ear. Initially reactive to painful stimuli only. On arrival to ED purposeful movement and spastic/uncooperative.

## 2020-09-10 NOTE — ED Notes (Signed)
Pt's mother Larita Fife, calling and requests an update from MD. Number updated in chart.

## 2020-09-11 ENCOUNTER — Inpatient Hospital Stay (HOSPITAL_COMMUNITY): Payer: 59

## 2020-09-11 DIAGNOSIS — S0219XB Other fracture of base of skull, initial encounter for open fracture: Secondary | ICD-10-CM

## 2020-09-11 LAB — TYPE AND SCREEN
ABO/RH(D): O POS
Antibody Screen: NEGATIVE

## 2020-09-11 LAB — BASIC METABOLIC PANEL
Anion gap: 10 (ref 5–15)
BUN: 7 mg/dL (ref 6–20)
CO2: 21 mmol/L — ABNORMAL LOW (ref 22–32)
Calcium: 8.7 mg/dL — ABNORMAL LOW (ref 8.9–10.3)
Chloride: 113 mmol/L — ABNORMAL HIGH (ref 98–111)
Creatinine, Ser: 0.84 mg/dL (ref 0.61–1.24)
GFR, Estimated: >60 mL/min (ref >60)
Glucose, Bld: 111 mg/dL — ABNORMAL HIGH (ref 70–99)
Potassium: 4.1 mmol/L (ref 3.5–5.1)
Sodium: 144 mmol/L (ref 135–145)

## 2020-09-11 LAB — CBC
HCT: 40.2 % (ref 39.0–52.0)
Hemoglobin: 13.7 g/dL (ref 13.0–17.0)
MCH: 30.8 pg (ref 26.0–34.0)
MCHC: 34.1 g/dL (ref 30.0–36.0)
MCV: 90.3 fL (ref 80.0–100.0)
Platelets: 269 10*3/uL (ref 150–400)
RBC: 4.45 MIL/uL (ref 4.22–5.81)
RDW: 12.3 % (ref 11.5–15.5)
WBC: 12.3 10*3/uL — ABNORMAL HIGH (ref 4.0–10.5)
nRBC: 0 % (ref 0.0–0.2)

## 2020-09-11 LAB — ABO/RH: ABO/RH(D): O POS

## 2020-09-11 MED ORDER — IOHEXOL 350 MG/ML SOLN
60.0000 mL | Freq: Once | INTRAVENOUS | Status: AC | PRN
  Administered 2020-09-11: 60 mL via INTRAVENOUS

## 2020-09-11 MED ORDER — IOHEXOL 350 MG/ML SOLN
75.0000 mL | Freq: Once | INTRAVENOUS | Status: AC | PRN
  Administered 2020-09-11: 75 mL via INTRAVENOUS

## 2020-09-11 MED ORDER — CHLORHEXIDINE GLUCONATE CLOTH 2 % EX PADS
6.0000 | MEDICATED_PAD | Freq: Every day | CUTANEOUS | Status: DC
  Administered 2020-09-11 – 2020-09-13 (×3): 6 via TOPICAL

## 2020-09-11 MED ORDER — SODIUM CHLORIDE 0.9% IV SOLUTION: Freq: Once | INTRAVENOUS | Status: DC

## 2020-09-11 NOTE — Progress Notes (Signed)
Inpatient Rehab Admissions Coordinator:    Inpatient rehab consult./prescreen received. Noted COVID + 09/10/20. Patients are eligible to be considered for admit to the [facility] when cleared from airborne precautions by acute MD or Infectious disease regardless of onset day.  Please call me with any questions.    Megan Salon, MS, CCC-SLP Rehab Admissions Coordinator  [phone] (celll) [phone] (office)

## 2020-09-11 NOTE — Consult Note (Addendum)
Reason for Consult: Traumatic brain injury Referring Physician: Trauma  Bedside consult time 9:50 AM  Elijah [name] is an 25 y.o. male.  HPI: 25 year old gentleman intoxicated found at bases steps presumed fall but no information patient was taken to the ER last night agitated intoxicated underwent pan scans and admitted  No past medical history on file.    No family history on file.  Social History:  has no history on file for tobacco use, alcohol use, and drug use.  Allergies: No Known Allergies  Medications: I have reviewed the patient's current medications.  Results for orders placed or performed during the hospital encounter of 09/10/20 (from the past 48 hour(s))  Resp Panel by RT-PCR (Flu A&B, Covid) Nasopharyngeal Swab     Status: Abnormal   Collection Time: 09/10/20  6:04 PM   Specimen: Nasopharyngeal Swab; Nasopharyngeal(NP) swabs in vial transport medium  Result Value Ref Range   SARS Coronavirus 2 by RT PCR POSITIVE (A) NEGATIVE    Comment: RESULT CALLED TO, READ BACK BY AND VERIFIED WITH: RN SARAH B. [id number] FCP (NOTE) SARS-CoV-2 target nucleic acids are DETECTED.  The SARS-CoV-2 RNA is generally detectable in upper respiratory specimens during the acute phase of infection. Positive results are indicative of the presence of the identified virus, but do not rule out bacterial infection or co-infection with other pathogens not detected by the test. Clinical correlation with patient history and other diagnostic information is necessary to determine patient infection status. The expected result is Negative.  Fact Sheet for Patients: BloggerCourse.com  Fact Sheet for Healthcare Providers: SeriousBroker.it  This test is not yet approved or cleared by the [place] FDA and  has been authorized for detection and/or diagnosis of SARS-CoV-2 by FDA under an Emergency Use Authorization (EUA).  This EUA  will remain in effect (meaning this test can be used)  for the duration of  the COVID-19 declaration under Section 564(b)(1) of the Act, 21 U.S.C. section 360bbb-3(b)(1), unless the authorization is terminated or revoked sooner.     Influenza A by PCR NEGATIVE NEGATIVE   Influenza B by PCR NEGATIVE NEGATIVE    Comment: (NOTE) The Xpert Xpress SARS-CoV-2/FLU/RSV plus assay is intended as an aid in the diagnosis of influenza from Nasopharyngeal swab specimens and should not be used as a sole basis for treatment. Nasal washings and aspirates are unacceptable for Xpert Xpress SARS-CoV-2/FLU/RSV testing.  Fact Sheet for Patients: BloggerCourse.com  Fact Sheet for Healthcare Providers: SeriousBroker.it  This test is not yet approved or cleared by the [place] FDA and has been authorized for detection and/or diagnosis of SARS-CoV-2 by FDA under an Emergency Use Authorization (EUA). This EUA will remain in effect (meaning this test can be used) for the duration of the COVID-19 declaration under Section 564(b)(1) of the Act, 21 U.S.C. section 360bbb-3(b)(1), unless the authorization is terminated or revoked.  Performed at [facility], [address], [place], [place] [address]   Sample to Blood Bank     Status: None   Collection Time: 09/10/20  6:08 PM  Result Value Ref Range   Blood Bank Specimen SAMPLE AVAILABLE FOR TESTING    Sample Expiration      09/11/2020,2359 Performed at [facility], [address] [place], [place] [address]   ABO/Rh     Status: None   Collection Time: 09/10/20  6:08 PM  Result Value Ref Range   ABO/RH(D)      O POS Performed at [facility], [address] [place], [place] [address]   Comprehensive metabolic panel     Status: Abnormal   Collection Time: 09/10/20  6:13 PM  Result Value Ref Range   Sodium 136 135 - 145 mmol/L   Potassium 3.5 3.5 - 5.1 mmol/L   Chloride 104  98 - 111 mmol/L   CO2 22 22 - 32 mmol/L   Glucose, Bld 112 (H) 70 - 99 mg/dL    Comment: Glucose reference range applies only to samples taken after fasting for at least 8 hours.   BUN 9 6 - 20 mg/dL   Creatinine, Ser 6.040.92 0.61 - 1.24 mg/dL   Calcium 8.6 (L) 8.9 - 10.3 mg/dL   Total Protein 6.7 6.5 - 8.1 g/dL   Albumin 3.8 3.5 - 5.0 g/dL   AST 23 15 - 41 U/L   ALT 18 0 - 44 U/L   Alkaline Phosphatase 52 38 - 126 U/L   Total Bilirubin 0.6 0.3 - 1.2 mg/dL   GFR, Estimated >54>60 >09>60 mL/min    Comment: (NOTE) Calculated using the CKD-EPI Creatinine Equation (2021)    Anion gap 10 5 - 15    Comment: Performed at [facility], [address] [place], [place] [address]  I-Stat Chem 8, ED     Status: Abnormal   Collection Time: 09/10/20  6:13 PM  Result Value Ref Range   Sodium 139 135 - 145 mmol/L   Potassium 3.6 3.5 - 5.1 mmol/L   Chloride 104 98 - 111 mmol/L   BUN 9 6 - 20 mg/dL   Creatinine, Ser 4.781.30 (H) 0.61 - 1.24 mg/dL   Glucose, Bld 295112 (H) 70 - 99 mg/dL    Comment: Glucose reference range applies only to samples taken after fasting for at least 8 hours.   Calcium, Ion 0.99 (L) 1.15 - 1.40 mmol/L   TCO2 22 22 - 32 mmol/L   Hemoglobin 14.6 13.0 - 17.0 g/dL   HCT 62.143.0 30.839.0 - 65.752.0 %  CBC     Status: Abnormal   Collection Time: 09/10/20  6:13 PM  Result Value Ref Range   WBC 12.5 (H) 4.0 - 10.5 K/uL   RBC 4.72 4.22 - 5.81 MIL/uL   Hemoglobin 14.1 13.0 - 17.0 g/dL   HCT 84.643.9 96.239.0 - 95.252.0 %   MCV 93.0 80.0 - 100.0 fL   MCH 29.9 26.0 - 34.0 pg   MCHC 32.1 30.0 - 36.0 g/dL   RDW 84.111.9 32.411.5 - 40.115.5 %   Platelets 325 150 - 400 K/uL   nRBC 0.0 0.0 - 0.2 %    Comment: Performed at [facility], [address] [place], [place] [address]  Ethanol     Status: Abnormal   Collection Time: 09/10/20  6:13 PM  Result Value Ref Range   Alcohol, Ethyl (B) 343 (HH) <10 mg/dL    Comment: CRITICAL RESULT CALLED TO, READ BACK BY AND VERIFIED WITHCherlyn Labella: S BERTRAND RN [phone] [phone] [phone]  FORSYTH (NOTE) Lowest detectable limit for serum alcohol is 10 mg/dL.  For medical purposes only. Performed at [facility], [address] [place], [place] [address]   Lactic acid, plasma     Status: Abnormal   Collection Time: 09/10/20  6:13 PM  Result Value Ref Range   Lactic Acid, Venous 2.3 (HH) 0.5 - 1.9 mmol/L    Comment: CRITICAL RESULT CALLED TO, READ BACK BY AND VERIFIED WITHCherlyn Labella: S BERTRAND RN [phone]  Marveen Reeks Performed at [facility], [address] [place], [place] [address]   Protime-INR     Status: None   Collection Time: 09/10/20  6:13 PM  Result Value Ref Range   Prothrombin Time 12.1 11.4 - 15.2 seconds   INR 0.9 0.8 - 1.2    Comment: (NOTE) INR goal varies based on device and disease states. Performed at [facility], [address] [place], [place] [address]   HIV Antibody (routine testing w rflx)     Status: None   Collection Time: 09/10/20  6:13 PM  Result Value Ref Range   HIV Screen 4th Generation wRfx Non Reactive Non Reactive    Comment: Performed at [facility], [address], [place], [place] [address]  Magnesium     Status: None   Collection Time: 09/10/20  6:13 PM  Result Value Ref Range   Magnesium 2.2 1.7 - 2.4 mg/dL    Comment: Performed at [facility], [address], [place], [place] [address]  Phosphorus     Status: None   Collection Time: 09/10/20  6:13 PM  Result Value Ref Range   Phosphorus 3.1 2.5 - 4.6 mg/dL    Comment: Performed at [facility], [address] [place], [place] [address]  Urinalysis, Routine w reflex microscopic Urine, Catheterized     Status: Abnormal   Collection Time: 09/10/20  6:38 PM  Result Value Ref Range   Color, Urine STRAW (A) YELLOW   APPearance CLEAR CLEAR   Specific Gravity, Urine 1.012 1.005 - 1.030   pH 6.0 5.0 - 8.0   Glucose, UA NEGATIVE NEGATIVE mg/dL   Hgb urine dipstick NEGATIVE NEGATIVE   Bilirubin Urine NEGATIVE NEGATIVE   Ketones, ur NEGATIVE  NEGATIVE mg/dL   Protein, ur NEGATIVE NEGATIVE mg/dL   Nitrite NEGATIVE NEGATIVE   Leukocytes,Ua NEGATIVE NEGATIVE    Comment: Performed at [facility], [address], [place], [place] [address]  Rapid urine drug screen (hospital performed)     Status: Abnormal   Collection Time: 09/10/20  6:38 PM  Result Value Ref Range   Opiates NONE DETECTED NONE DETECTED   Cocaine NONE DETECTED NONE DETECTED   Benzodiazepines NONE DETECTED NONE DETECTED   Amphetamines NONE DETECTED NONE DETECTED   Tetrahydrocannabinol POSITIVE (A) NONE DETECTED   Barbiturates NONE DETECTED NONE DETECTED    Comment: (NOTE) DRUG SCREEN FOR MEDICAL PURPOSES ONLY.  IF CONFIRMATION IS NEEDED FOR ANY PURPOSE, NOTIFY LAB WITHIN 5 DAYS.  LOWEST DETECTABLE LIMITS FOR URINE DRUG SCREEN Drug Class                     Cutoff (ng/mL) Amphetamine and metabolites    1000 Barbiturate and metabolites    200 Benzodiazepine                 200 Tricyclics and metabolites     300 Opiates and metabolites        300 Cocaine and metabolites        300 THC                            50 Performed at [facility], [address] [place], [place] [address]   MRSA PCR Screening     Status: None   Collection Time: 09/10/20  8:15 PM   Specimen: Nasal Mucosa; Nasopharyngeal  Result Value  Ref Range   MRSA by PCR NEGATIVE NEGATIVE    Comment:        The GeneXpert MRSA Assay (FDA approved for NASAL specimens only), is one component of a comprehensive MRSA colonization surveillance program. It is not intended to diagnose MRSA infection nor to guide or monitor treatment for MRSA infections. Performed at [facility], [address] [place], [place] [address]   CBC     Status: Abnormal   Collection Time: 09/11/20  4:33 AM  Result Value Ref Range   WBC 12.3 (H) 4.0 - 10.5 K/uL   RBC 4.45 4.22 - 5.81 MIL/uL   Hemoglobin 13.7 13.0 - 17.0 g/dL   HCT 47.4 25.9 - 56.3 %   MCV 90.3 80.0 - 100.0 fL   MCH 30.8  26.0 - 34.0 pg   MCHC 34.1 30.0 - 36.0 g/dL   RDW 87.5 64.3 - 32.9 %   Platelets 269 150 - 400 K/uL   nRBC 0.0 0.0 - 0.2 %    Comment: Performed at [facility], [address], [place], [place] [address]  Basic metabolic panel     Status: Abnormal   Collection Time: 09/11/20  4:33 AM  Result Value Ref Range   Sodium 144 135 - 145 mmol/L   Potassium 4.1 3.5 - 5.1 mmol/L   Chloride 113 (H) 98 - 111 mmol/L   CO2 21 (L) 22 - 32 mmol/L   Glucose, Bld 111 (H) 70 - 99 mg/dL    Comment: Glucose reference range applies only to samples taken after fasting for at least 8 hours.   BUN 7 6 - 20 mg/dL   Creatinine, Ser 1.66 0.61 - 1.24 mg/dL   Calcium 8.7 (L) 8.9 - 10.3 mg/dL   GFR, Estimated >06 >30 mL/min    Comment: (NOTE) Calculated using the CKD-EPI Creatinine Equation (2021)    Anion gap 10 5 - 15    Comment: Performed at [facility], [address], [place], [place] [address]  Type and screen [facility]     Status: None   Collection Time: 09/11/20  8:06 AM  Result Value Ref Range   ABO/RH(D) O POS    Antibody Screen NEG    Sample Expiration      09/14/2020,2359 Performed at [facility], [address], [place], [place] [address]     CT HEAD WO CONTRAST  Result Date: 09/11/2020 CLINICAL DATA:  Traumatic intracranial hemorrhage, follow-up EXAM: CT HEAD WITHOUT CONTRAST TECHNIQUE: Contiguous axial images were obtained from the base of the skull through the vertex without intravenous contrast. COMPARISON:  09/10/2020 FINDINGS: Brain: Bilateral sulcal subarachnoid hemorrhage is again identified. Suspect trace hemorrhage layering within the left occipital horn and interpeduncular cistern. There are likely adjacent hemorrhagic parenchymal contusions in the inferolateral right temporal lobe, right perisylvian convexity, and superolateral left temporal lobe (series 3, images 12, 18, and 16). These are likely evolving as expected with increased hemorrhage.  Minor subdural hemorrhage is present along the posterior falx and tentorium. Gray-white differentiation is preserved. There is no hydrocephalus. No significant mass effect. Vascular: No new findings. Skull: Left calvarial fracture. Sinuses/Orbits: No new findings. Other: No new findings. IMPRESSION: Similar small volume bilateral sulcal subarachnoid hemorrhage. Increased visibility of evolving small bilateral hemorrhagic parenchymal contusions. Minor falcine and tentorial subdural hemorrhage. No significant mass effect.  No hydrocephalus. Electronically Signed   By: Guadlupe Spanish M.D.   On: 09/11/2020 08:23   CT  Head Wo Contrast  Result Date: 09/10/2020 CLINICAL DATA:  Multi trauma EXAM: CT HEAD WITHOUT CONTRAST TECHNIQUE: Contiguous axial images were obtained from the base of the skull through the vertex without intravenous contrast. COMPARISON:  None. FINDINGS: Brain: There is traumatic subarachnoid hemorrhage on both sides, more voluminous on the right than the left. No definite intraparenchymal hematoma. One could question early evidence of a minimal hemorrhagic contusion of the inferior right temporal lobe just superior to the temporal bone. No large intraparenchymal hematoma. There is a thin subdural hematoma along the lateral aspect of the middle cranial fossa on the left. I do not see a finding to in bow the potential of epidural hematoma. No sign of ischemic infarction. No hydrocephalus. Small amount of pneumocephalus on the left associated with fractures described below. Vascular: No primary vascular lesion. Skull: Multiple fractures of the petrous and squamous temporal bones on the left. Longitudinal fracture of the left temporal bone traversing the middle ear. Definite ossicular disruption is not demonstrated. Fluid in the middle ear and mastoid air cells. No definite otic capsule breach. Consider detailed temporal bone study. Fracture lines do extend to the carotid canal. Consider CT angiography.  Fractures of the squamous portion of the temporal bone include a minimally displaced fragment. Nondepressed fracture of the inferior left parietal bone. Sinuses/Orbits: Frontal, ethmoid and maxillary sinuses are clear. Air-fluid level in the sphenoid sinus related to a minimal fracture at the left lateral wall and floor. Other: None IMPRESSION: 1. Traumatic subarachnoid hemorrhage on both sides, more voluminous on the right than the left. No definite intraparenchymal hematoma. One could question early evidence of a minimal hemorrhagic contusion of the inferior right temporal lobe just superior to the temporal bone. 2. Thin subdural hematoma along the lateral aspect of the middle cranial fossa on the left. No mass effect upon the brain. 3. Multiple fractures of the petrous and squamous temporal bones on the left. Longitudinal fracture of the left temporal bone traversing the middle ear. Definite ossicular disruption is not demonstrated. Fluid in the middle ear and mastoid air cells. No definite otic capsule breach. Consider detailed temporal bone study. Fracture lines extend to the carotid canal. Consider CT angiography. 4. Nondepressed fracture of the inferior left parietal bone. 5. Air-fluid level in the sphenoid sinus related to a minimal fracture at the left lateral wall and floor. Electronically Signed   By: Paulina Fusi M.D.   On: 09/10/2020 19:02   CT ANGIO NECK W OR WO CONTRAST  Result Date: 09/11/2020 CLINICAL DATA:  Fall with intracranial hemorrhage and skull fracture EXAM: CT ANGIOGRAPHY NECK TECHNIQUE: Multidetector CT imaging of the neck was performed using the standard protocol during bolus administration of intravenous contrast. Multiplanar CT image reconstructions and MIPs were obtained to evaluate the vascular anatomy. Carotid stenosis measurements (when applicable) are obtained utilizing NASCET criteria, using the distal internal carotid diameter as the denominator. CONTRAST:  75mL OMNIPAQUE  IOHEXOL 350 MG/ML SOLN, 60mL OMNIPAQUE IOHEXOL 350 MG/ML SOLN COMPARISON:  None. FINDINGS: Motion artifact is present.  Poor contrast bolus timing. Aortic arch: Great vessel origins appear patent with artifact obscuring much of the innominate. Right carotid system: Grossly patent without evidence of traumatic injury. Left carotid system: Grossly patent without evidence of traumatic injury. Visualized intracranial portion at the skull base is poorly evaluated. Vertebral arteries: Grossly patent without evidence of traumatic injury. Skeleton: Better evaluated on prior cervical spine imaging. Other neck: Infiltration of the left supraclavicular fossa fat is likely posttraumatic. Upper chest: Refer  to prior chest CT. IMPRESSION: Poor evaluation due to motion artifact and contrast bolus timing. There is grossly no evidence of traumatic vascular injury, but portions of the study are nondiagnostic. Electronically Signed   By: Guadlupe Spanish M.D.   On: 09/11/2020 08:41   CT Cervical Spine Wo Contrast  Result Date: 09/10/2020 CLINICAL DATA:  Multi trauma.  Attended. EXAM: CT CERVICAL SPINE WITHOUT CONTRAST TECHNIQUE: Multidetector CT imaging of the cervical spine was performed without intravenous contrast. Multiplanar CT image reconstructions were also generated. COMPARISON:  None. FINDINGS: Alignment: Normal Skull base and vertebrae: No evidence of cervical spine fracture. See results of head CT for multiple fractures. Soft tissues and spinal canal: Neck soft tissues are negative. Disc levels: Very minimal degenerative spondylosis C4-5 and C5-6. No stenosis. Upper chest: Clear Other: None IMPRESSION: No traumatic cervical finding. Very minimal degenerative spondylosis C4-5 and C5-6. See results of head CT for multiple fractures described in that report. Electronically Signed   By: Paulina Fusi M.D.   On: 09/10/2020 19:04   DG Pelvis Portable  Result Date: 09/10/2020 CLINICAL DATA:  Pain status post fall EXAM:  PORTABLE PELVIS 1-2 VIEWS COMPARISON:  None. FINDINGS: There is no evidence of pelvic fracture or diastasis. No pelvic bone lesions are seen. IMPRESSION: Negative. Electronically Signed   By: Katherine Mantle M.D.   On: 09/10/2020 18:26   CT CHEST ABDOMEN PELVIS W CONTRAST  Result Date: 09/10/2020 CLINICAL DATA:  Multi trauma EXAM: CT CHEST, ABDOMEN, AND PELVIS WITH CONTRAST TECHNIQUE: Multidetector CT imaging of the chest, abdomen and pelvis was performed following the standard protocol during bolus administration of intravenous contrast. CONTRAST:  OMNIPAQUE IOHEXOL 300 MG/ML  SOLN COMPARISON:  None. FINDINGS: CT CHEST FINDINGS Cardiovascular: Heart size is normal. No pericardial fluid. No evidence of vascular injury in the chest. Mediastinum/Nodes: No evidence of mediastinal hemorrhage. No mass or adenopathy. Lungs/Pleura: No pneumothorax or hemothorax. Lungs are clear except for a very tiny emphysematous bleb at the right apex. No evidence of contusion or aspiration. Musculoskeletal: No fracture of the thoracic spine, ribs or sternum. CT ABDOMEN PELVIS FINDINGS Hepatobiliary: Normal Pancreas: Normal Spleen: Normal Adrenals/Urinary Tract: Adrenal glands are normal. Kidneys are normal. Bladder is normal. Stomach/Bowel: Normal Vascular/Lymphatic: Normal Reproductive: Normal Other: No free fluid or air. Musculoskeletal: No lumbar spine, pelvic or hip fracture. IMPRESSION: Normal CT scan of the chest, abdomen and pelvis. No traumatic finding. Electronically Signed   By: Paulina Fusi M.D.   On: 09/10/2020 19:08   DG Chest Port 1 View  Result Date: 09/10/2020 CLINICAL DATA:  Pain status post fall EXAM: PORTABLE CHEST 1 VIEW COMPARISON:  None. FINDINGS: The heart size and mediastinal contours are within normal limits. Both lungs are clear. The visualized skeletal structures are unremarkable. IMPRESSION: No active disease. Electronically Signed   By: Katherine Mantle M.D.   On: 09/10/2020 18:28   CT  MAXILLOFACIAL WO CONTRAST  Addendum Date: 09/11/2020   ADDENDUM REPORT: 09/11/2020 08:42 ADDENDUM: As stated in the findings, there is mild irregularity of the roof of the sphenoid sinus. A nondisplaced fracture is possible. Electronically Signed   By: Guadlupe Spanish M.D.   On: 09/11/2020 08:42   Result Date: 09/11/2020 CLINICAL DATA:  Facial trauma EXAM: CT MAXILLOFACIAL WITHOUT CONTRAST TECHNIQUE: Multidetector CT imaging of the maxillofacial structures was performed. Multiplanar CT image reconstructions were also generated. COMPARISON:  None. FINDINGS: Osseous: Calvarial fracture involving temporal bone as described elsewhere. Mild irregularity along the roof of the left sphenoid sinus  without definite fracture. No acute facial fracture. Temporomandibular joints are intact. Orbits: No intraorbital hematoma. Sinuses: Mild mucosal thickening. Retained secretions or debris within the sphenoid sinuses. Soft tissues: No large facial hematoma. Limited intracranial: Dictated separately. IMPRESSION: No acute facial fracture. Electronically Signed: By: Guadlupe Spanish M.D. On: 09/11/2020 08:32   CT TEMPORAL BONES WO CONTRAST  Result Date: 09/11/2020 CLINICAL DATA:  Temporal bone fracture EXAM: CT TEMPORAL BONES WITHOUT CONTRAST TECHNIQUE: Axial and coronal plane CT imaging of the petrous temporal bones was performed with thin-collimation image reconstruction. No intravenous contrast was administered. Multiplanar CT image reconstructions were also generated. COMPARISON:  None. FINDINGS: Right temporal bone: External auditory canal and tympanic membrane are unremarkable. Middle ear cleft and ossicles are unremarkable. Cochlea and semicircular canals are unremarkable. Tegmen is intact. Facial nerve demonstrates normal course. Mastoid air cells are clear. Left temporal bone: As seen on prior head CT, there is a fracture of the left parietal calvarium extending into the squamosal and mastoid portions of the temporal  bone. Fracture extends through the posterior and anterior walls of the external auditory canal, which is partially opacified. Partial opacification of the mastoid air cells. Tympanic membrane is not well evaluated. Partial opacification of the middle ear cleft. Ossicles appear intact. Cochlea and semicircular canals are unremarkable. Tegmen appears intact. Facial nerve demonstrates normal course. IMPRESSION: Fracture involves squamosal and mastoid portions of the left temporal bone. Otic capsule is spared. There is involvement of anterior and posterior walls of the external auditory canal. Partial opacification of mastoid air cells, middle ear, and external auditory canal, which may reflect a combination of fluid and hemorrhage. Electronically Signed   By: Guadlupe Spanish M.D.   On: 09/11/2020 08:15    Review of Systems Blood pressure 116/69, pulse 67, temperature 97.6 F (36.4 C), temperature source Oral, resp. rate 17, height 6' (1.829 m), weight 79.5 kg, SpO2 95 %. Physical Exam  Assessment/Plan: Hospital day 1 traumatic brain injury skim subdural small amount of traumatic subarachnoid hemorrhage patient is awake CNS follow-up CT scan is stable with slight evolution of the subarachnoid hemorrhage but no significant increase in size of the subdural no mass-effect this is nonoperative management.  Temporal bone fractures to be managed by ENT cervical spine CT negative continue cervical collar until patient is less confused less agitated and we can clear his neck clinically.  May mobilize otherwise per trauma  Mariam Dollar may mobilize 09/11/2020, 10:49 AM

## 2020-09-11 NOTE — Evaluation (Addendum)
Occupational Therapy Evaluation Patient Details Name: Elijah [name] MRN: [id number] DOB: 14-Sep-1995 Today's Date: 09/11/2020    History of Present Illness 25 yo male presenting via EMS after being found down at bottom of stairs. Head CT showing [facility] bilaterally (R>L).  L inf parietal bone fx; Temporal bone fx, crossing middle ear, possible sphenoid sinus fx. Tested COVID-19 + on 2/20. No significant PMH noted.   Clinical Impression   PTA, pt living with a roommate, [name], and was independent working full time as a "cutting down trees". Pt currently requiring Max A +2 for all ADLs and functional mobility. Pt presenting with poor cognition, balance, cooridnation, safety, and activity tolerance. Presenting at a Rachos V level: alert and following simple commands with increased cues, poor ST memory, and not oriented. Calling pt's mom while in room to allow him to have conversation, mom reorienting pt to situation as well. Pt reporting pain at L shoulder throughout mobility and noting increased muscle tension and coloration at L traps. Pt will require further acute OT to facilitate safe dc. Recommend dc to CIR for intensive OT to optimize safety, independence with ADLs, and return to PLOF.   Encouraging mom to bring pictures of family and friends with labels of names.     Follow Up Recommendations  CIR    Equipment Recommendations  Other (comment) (Defer to next venue)    Recommendations for Other Services Speech consult;PT consult;Rehab consult     Precautions / Restrictions Precautions Precautions: Fall;Other (comment) (Rancho V)      Mobility Bed Mobility Overal bed mobility: Needs Assistance Bed Mobility: Supine to Sit;Sit to Supine     Supine to sit: Min assist;+2 for physical assistance;+2 for safety/equipment Sit to supine: Min assist;+2 for physical assistance;+2 for safety/equipment   General bed mobility comments: MIn A +2 for safety and support at trunk. Pt not following  cues for log roll tecnique    Transfers Overall transfer level: Needs assistance Equipment used: 2 person hand held assist Transfers: Sit to/from [employer] Sit to Stand: Mod assist;+2 physical assistance Stand pivot transfers: Max assist;+2 physical assistance;+2 safety/equipment       General transfer comment: Mod A +2 for power up and to maintain balance. Max A +2 for pivot to/from recliner. Pt with ataxia movement and wide BOS.    Balance Overall balance assessment: Needs assistance Sitting-balance support: No upper extremity supported;Feet supported Sitting balance-Leahy Scale: Poor Sitting balance - Comments: L lateral lean and poor awareness. Needing MIn A atleast if not more   Standing balance support: Bilateral upper extremity supported;During functional activity Standing balance-Leahy Scale: Poor                             ADL either performed or assessed with clinical judgement   ADL Overall ADL's : Needs assistance/impaired Eating/Feeding: NPO   Grooming: Maximal assistance;Sitting   Upper Body Bathing: Maximal assistance;Sitting   Lower Body Bathing: Maximal assistance;Sit to/from stand;+2 for safety/equipment;+2 for physical assistance   Upper Body Dressing : Maximal assistance;Sitting   Lower Body Dressing: Maximal assistance;Bed level Lower Body Dressing Details (indicate cue type and reason): Max A to don socks Toilet Transfer: Maximal assistance;+2 for physical assistance;Stand-pivot (Simulated to recliner) Toilet Transfer Details (indicate cue type and reason): Max A +2 for maintaining balance; ataxia movement         Functional mobility during ADLs: Maximal assistance;+2 for safety/equipment;+2 for physical assistance (stand pivot) General ADL  Comments: Pt requiring MAx A for ADLs and functional trnasfers. Poor balance, cognition, coorindation, and safety     Vision   Additional Comments: R pulsating nystagmus      Perception     Praxis      Pertinent Vitals/Pain Pain Assessment: Faces Faces Pain Scale: Hurts even more Pain Location: L shoulder Pain Descriptors / Indicators: Discomfort;Grimacing;Guarding Pain Intervention(s): Monitored during session;Limited activity within patient's tolerance;Repositioned     Hand Dominance Right   Extremity/Trunk Assessment Upper Extremity Assessment Upper Extremity Assessment: LUE deficits/detail;Difficult to assess due to impaired cognition (Ataxia) LUE Deficits / Details: Complaint of L shoulder pain. AROM 0-20.Refused further ROM LUE Coordination: decreased fine motor;decreased gross motor   Lower Extremity Assessment Lower Extremity Assessment: Defer to PT evaluation   Cervical / Trunk Assessment Cervical / Trunk Assessment: Other exceptions Cervical / Trunk Exceptions: Awaiting C-spine to be cleared   Communication Communication Communication: No difficulties   Cognition Arousal/Alertness: Awake/alert Behavior During Therapy: Restless;Flat affect Overall Cognitive Status: Impaired/Different from baseline Area of Impairment: Attention;Memory;Following commands;Safety/judgement;Awareness;Problem solving;Rancho level               Rancho Levels of Cognitive Functioning Rancho Los Amigos Scales of Cognitive Functioning: Confused/inappropriate/non-agitated   Current Attention Level: Sustained Memory: Decreased short-term memory;Decreased recall of precautions Following Commands: Follows one step commands inconsistently;Follows one step commands with increased time Safety/Judgement: Decreased awareness of safety;Decreased awareness of deficits Awareness: Intellectual Problem Solving: Slow processing;Requires verbal cues General Comments: Frequently forgetting cues about orientation, brace needs, and safety. Pt alert and following simple commands with increased time and cues. Not oriented. With increased stimuli, pt becoming more restles and  almost agitated.   General Comments  VSS. RA    Exercises     Shoulder Instructions      Home Living Family/patient expects to be discharged to:: Private residence Living Arrangements: Non-relatives/Friends Available Help at Discharge: Family;Available 24 hours/day Type of Home: Other(Comment) (Condo)                           Additional Comments: Mom (via phone) confirms that pt can stay with her; 24/7 provided by mom and sister      Prior Functioning/Environment Level of Independence: Independent        Comments: ADLs, IADLs, and works "cutting trees"        OT Problem List: Decreased range of motion;Decreased strength;Decreased activity tolerance;Impaired balance (sitting and/or standing);Decreased knowledge of precautions;Decreased safety awareness;Decreased knowledge of use of DME or AE;Decreased cognition;Decreased coordination;Pain;Impaired vision/perception      OT Treatment/Interventions: Self-care/ADL training;Therapeutic exercise;Energy conservation;Therapeutic activities;Patient/family education;DME and/or AE instruction    OT Goals(Current goals can be found in the care plan section) Acute Rehab OT Goals Patient Stated Goal: "I am going to call my buddy, he will come get me." OT Goal Formulation: With patient Time For Goal Achievement: 09/25/20 Potential to Achieve Goals: Good  OT Frequency: Min 2X/week   Barriers to D/C:            Co-evaluation PT/OT/SLP Co-Evaluation/Treatment: Yes Reason for Co-Treatment: To address functional/ADL transfers;For patient/therapist safety   OT goals addressed during session: ADL's and self-care      AM-PAC OT "6 Clicks" Daily Activity     Outcome Measure Help from another person eating meals?: Total Help from another person taking care of personal grooming?: A Lot Help from another person toileting, which includes using toliet, bedpan, or urinal?: A Lot Help from another person bathing (  including  washing, rinsing, drying)?: A Lot Help from another person to put on and taking off regular upper body clothing?: A Lot Help from another person to put on and taking off regular lower body clothing?: A Lot 6 Click Score: 11   End of Session Equipment Utilized During Treatment: Cervical collar Nurse Communication: Mobility status;Other (comment) (L shoulder pain)  Activity Tolerance: Patient limited by fatigue Patient left: in bed;with call bell/phone within reach;with bed alarm set;with restraints reapplied  OT Visit Diagnosis: Unsteadiness on feet (R26.81);Other abnormalities of gait and mobility (R26.89);Muscle weakness (generalized) (M62.81);Pain;Ataxia, unspecified (R27.0);History of falling (Z91.81)                Time: 9024-0973 OT Time Calculation (min): 39 min Charges:  OT General Charges $OT Visit: 1 Visit OT Evaluation $OT Eval Moderate Complexity: 1 Mod OT Treatments $Self Care/Home Management : 8-22 mins  Mutasim Tuckey MSOT, OTR/L Acute Rehab Pager: [phone] Office: [phone]   Theodoro Grist Sharnika Binney 09/11/2020, 11:29 AM

## 2020-09-11 NOTE — Progress Notes (Signed)
Orthopedic Tech Progress Note Patient Details:  Elijah [name] 1995-11-26 [id number] Called in order to [facility] for an ASPEN CERVICAL COLLAR.  Patient ID: Elijah [name], male   DOB: [birth year], 25 y.o.   MRN: [id number]   Donald Pore 09/11/2020, 8:16 AM

## 2020-09-11 NOTE — Evaluation (Signed)
Physical Therapy Evaluation Patient Details Name: Elijah [name] [name] MRN: [id number] DOB: March 03, 1996 [name] Date: 09/11/2020   History of Present Illness  25 yo male presenting via EMS after being found down at bottom of stairs. Head CT showing [facility] bilaterally (R>L).  L inf parietal bone fx; Temporal bone fx, crossing middle ear, possible sphenoid sinus fx. C-spine cleared via imaging, but not tested clinically yet secondary to pt cognitive status.Tested COVID-19 + on 2/20. No significant PMH noted.  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 Pt to benefit from acute PT to address deficits. Pt requiring min-max +2 assist for bed mobility and pivot to and from recliner this day, pt limited by L shoulder pain, impaired command following, and weakness. Pt is not oriented, claims he did not fall, and has no insight into current deficits. PT recommending CIR post-acutely. PT to progress mobility as tolerated, and will continue to follow acutely.      Follow Up Recommendations CIR    Equipment Recommendations  None recommended by PT    Recommendations for Other Services Rehab consult     Precautions / Restrictions Precautions Precautions: Fall;Other (comment) (Rancho V) Required Braces or Orthoses: Cervical Brace Cervical Brace: Hard collar;At all times;Other (comment) (C-spine not clincally cleared yet, maintain collar until pt becomes less confused/agitated per notes)      Mobility  Bed Mobility Overal bed mobility: Needs Assistance Bed Mobility: Supine to Sit;Sit to Supine     Supine to sit: Min assist;+2 for physical assistance;+2 for safety/equipment Sit to supine: Min assist;+2 for physical assistance;+2 for safety/equipment   General bed mobility comments: MIn A +2 for safety and support at trunk. Pt not following cues for log  roll tecnique    Transfers Overall transfer level: Needs assistance Equipment used: 2 person hand held assist Transfers: Sit to/from [employer] Sit to Stand: Mod assist;+2 physical assistance Stand pivot transfers: Max assist;+2 physical assistance;+2 safety/equipment       General transfer comment: Mod A +2 for power up and to maintain balance. Max A +2 for pivot to/from recliner. Pt with ataxic movement and wide BOS.  Ambulation/Gait             General Gait Details: nt  Information systems manager [employer] (Stroke Patients Only)       Balance Overall balance assessment: Needs assistance Sitting-balance support: No upper extremity supported;Feet supported Sitting balance-Leahy Scale: Poor Sitting balance - Comments: L lateral lean and poor awareness, requires at least min A   Standing balance support: Bilateral upper extremity supported;During functional activity Standing balance-Leahy Scale: Poor Standing balance comment: reliant on external support                             Pertinent Vitals/Pain Pain Assessment: Faces Faces Pain Scale: Hurts even more Pain Location: L shoulder Pain Descriptors / Indicators: Discomfort;Grimacing;Guarding Pain Intervention([name]): Limited activity within [name] tolerance;Monitored during session;Repositioned    Home Living Family/patient expects to be discharged to:: Private residence Living Arrangements: Non-relatives/Friends Available Help at Discharge: Family;Available 24 hours/day Type of Home: Other(Comment) (Condo)       Home Layout: Two level Home Equipment: None Additional Comments: Mom (via phone) confirms that pt can stay with her; 24/7 provided by mom and  sister    Prior Function Level of Independence: Independent         Comments: ADLs, IADLs, and works "cutting trees"     Hand Dominance   Dominant Hand: Right    Extremity/Trunk Assessment    Upper Extremity Assessment Upper Extremity Assessment: Defer to OT evaluation LUE Deficits / Details: Complaint of L shoulder pain. AROM 0-20.Refused further ROM LUE Coordination: decreased fine motor;decreased gross motor    Lower Extremity Assessment Lower Extremity Assessment: Generalized weakness    Cervical / Trunk Assessment Cervical / Trunk Assessment: Other exceptions Cervical / Trunk Exceptions: Awaiting C-spine to be cleared clinically  Communication   Communication: No difficulties  Cognition Arousal/Alertness: Awake/alert Behavior During Therapy: Restless;Flat affect Overall Cognitive Status: Impaired/Different from baseline Area of Impairment: Attention;Memory;Following commands;Safety/judgement;Awareness;Problem solving;Rancho level               Rancho Levels of Cognitive Functioning Rancho Los Amigos Scales of Cognitive Functioning: Confused/inappropriate/non-agitated   Current Attention Level: Sustained Memory: Decreased short-term memory;Decreased recall of precautions Following Commands: Follows one step commands inconsistently;Follows one step commands with increased time Safety/Judgement: Decreased awareness of safety;Decreased awareness of deficits Awareness: Intellectual Problem Solving: Slow processing;Requires verbal cues General Comments: Frequently forgetting cues about orientation, brace needs, and safety. Pt alert and following simple commands with increased time and cues. Not oriented. With increased stimuli, pt becoming more restless and almost agitated.      General Comments General comments (skin integrity, edema, etc.): vss, called mother on cell phone today with increased time and effort, intermittnet cues from OT to make call. R-beating nystagmus when eyes cross midline towards R, pt reporting intermittent dizziness    Exercises     Assessment/Plan    PT Assessment Patient needs continued PT services  PT Problem List Decreased  strength;Decreased mobility;Decreased safety awareness;Decreased activity tolerance;Decreased balance;Pain;Decreased cognition;Decreased knowledge of precautions;Decreased coordination       PT Treatment Interventions DME instruction;Therapeutic activities;Gait training;Therapeutic exercise;Patient/family education;Balance training;Functional mobility training;Neuromuscular re-education;Stair training    PT Goals (Current goals can be found in the Care Plan section)  Acute Rehab PT Goals Patient Stated Goal: "I am going to call my buddy, he will come get me." PT Goal Formulation: With patient Time For Goal Achievement: 09/25/20 Potential to Achieve Goals: Good    Frequency Min 4X/week   Barriers to discharge        Co-evaluation PT/OT/SLP Co-Evaluation/Treatment: Yes Reason for Co-Treatment: For patient/therapist safety;To address functional/ADL transfers;Necessary to address cognition/behavior during functional activity PT goals addressed during session: Mobility/safety with mobility;Balance OT goals addressed during session: ADL'[name] and self-care       AM-PAC PT "6 Clicks" Mobility  Outcome Measure Help needed turning from your back to your side while in a flat bed without using bedrails?: A Lot Help needed moving from lying on your back to sitting on the side of a flat bed without using bedrails?: A Lot Help needed moving to and from a bed to a chair (including a wheelchair)?: A Lot Help needed standing up from a chair using your arms (e.g., wheelchair or bedside chair)?: A Lot Help needed to walk in hospital room?: A Lot Help needed climbing 3-5 steps with a railing? : Total 6 Click Score: 11    End of Session Equipment Utilized During Treatment: Cervical collar Activity Tolerance: Patient tolerated treatment well;Patient limited by pain;Patient limited by fatigue Patient left: in bed;with call bell/phone within reach;with bed alarm set;with restraints reapplied (bilat  wrist, waist restraints; hand mitts) Nurse  Communication: Mobility status PT Visit Diagnosis: Difficulty in walking, not elsewhere classified (R26.2);Other symptoms and signs involving the nervous system (R29.898);Unsteadiness on feet (R26.81)    Time: 1001-1035 PT Time Calculation (min) (ACUTE ONLY): 34 min   Charges:   PT Evaluation $PT Eval Moderate Complexity: 1 Mod          Elijah [name] [name], PT Acute Rehabilitation Services Pager [phone]  Office [phone]   Freja Faro E Christain [place] 09/11/2020, 1:22 PM

## 2020-09-11 NOTE — Progress Notes (Addendum)
Follow up - Trauma Critical Care  Patient Details:    Elijah [name] is an 25 y.o. male.  Lines/tubes : External Urinary Catheter (Active)  Collection Container Standard drainage bag 09/10/20 2000  Site Assessment Clean;Intact 09/10/20 2000  Output (mL) 550 mL 09/11/20 0544    Microbiology/Sepsis markers: Results for orders placed or performed during the hospital encounter of 09/10/20  Resp Panel by RT-PCR (Flu A&B, Covid) Nasopharyngeal Swab     Status: Abnormal   Collection Time: 09/10/20  6:04 PM   Specimen: Nasopharyngeal Swab; Nasopharyngeal(NP) swabs in vial transport medium  Result Value Ref Range Status   SARS Coronavirus 2 by RT PCR POSITIVE (A) NEGATIVE Final    Comment: RESULT CALLED TO, READ BACK BY AND VERIFIED WITH: RN SARAH B. 1901 [id number] FCP (NOTE) SARS-CoV-2 target nucleic acids are DETECTED.  The SARS-CoV-2 RNA is generally detectable in upper respiratory specimens during the acute phase of infection. Positive results are indicative of the presence of the identified virus, but do not rule out bacterial infection or co-infection with other pathogens not detected by the test. Clinical correlation with patient history and other diagnostic information is necessary to determine patient infection status. The expected result is Negative.  Fact Sheet for Patients: BloggerCourse.com  Fact Sheet for Healthcare Providers: SeriousBroker.it  This test is not yet approved or cleared by the [place] FDA and  has been authorized for detection and/or diagnosis of SARS-CoV-2 by FDA under an Emergency Use Authorization (EUA).  This EUA will remain in effect (meaning this test can be used)  for the duration of  the COVID-19 declaration under Section 564(b)(1) of the Act, 21 U.S.C. section 360bbb-3(b)(1), unless the authorization is terminated or revoked sooner.     Influenza A by PCR NEGATIVE NEGATIVE Final    Influenza B by PCR NEGATIVE NEGATIVE Final    Comment: (NOTE) The Xpert Xpress SARS-CoV-2/FLU/RSV plus assay is intended as an aid in the diagnosis of influenza from Nasopharyngeal swab specimens and should not be used as a sole basis for treatment. Nasal washings and aspirates are unacceptable for Xpert Xpress SARS-CoV-2/FLU/RSV testing.  Fact Sheet for Patients: BloggerCourse.com  Fact Sheet for Healthcare Providers: SeriousBroker.it  This test is not yet approved or cleared by the [place] FDA and has been authorized for detection and/or diagnosis of SARS-CoV-2 by FDA under an Emergency Use Authorization (EUA). This EUA will remain in effect (meaning this test can be used) for the duration of the COVID-19 declaration under Section 564(b)(1) of the Act, 21 U.S.C. section 360bbb-3(b)(1), unless the authorization is terminated or revoked.  Performed at [facility], [address] [place], [place] [address]   MRSA PCR Screening     Status: None   Collection Time: 09/10/20  8:15 PM   Specimen: Nasal Mucosa; Nasopharyngeal  Result Value Ref Range Status   MRSA by PCR NEGATIVE NEGATIVE Final    Comment:        The GeneXpert MRSA Assay (FDA approved for NASAL specimens only), is one component of a comprehensive MRSA colonization surveillance program. It is not intended to diagnose MRSA infection nor to guide or monitor treatment for MRSA infections. Performed at [facility], [address], [place], [place] [address]     Anti-infectives:  Anti-infectives (From admission, onward)   None      Best Practice/Protocols:  VTE Prophylaxis: Mechanical; chemical pending neurosurgery recs  Consults: Neurosurgery pending as per Dr. Bedelia Person   Studies:  Events:  Subjective:    Overnight Issues: None reported  Objective:  Vital signs for last 24 hours: Temp:  [96.9 F (36.1 C)-97.6 F (36.4 C)]  97.6 F (36.4 C) (02/21 0316) Pulse Rate:  [65-93] 65 (02/21 0600) Resp:  [12-24] 19 (02/21 0600) BP: (97-138)/(51-86) 104/57 (02/21 0600) SpO2:  [95 %-100 %] 96 % (02/21 0600) Weight:  [79.5 kg-88.5 kg] 79.5 kg (02/20 2024)  Hemodynamic parameters for last 24 hours:    Intake/Output from previous day: 02/20 0701 - 02/21 0700 In: 1171.7 [I.V.:760.8; IV Piggyback:411] Out: 2300 [Urine:2300]  Intake/Output this shift: No intake/output data recorded.  Vent settings for last 24 hours:    Physical Exam:  General: alert, no respiratory distress and in restraints Neuro: alert, oriented, nonfocal exam and GCS 15 HEENT/Neck: no JVD and dried blood left ear Resp: clear to auscultation bilaterally CVS: regular rate and rhythm, S1, S2 normal, no murmur, click, rub or gallop GI: soft, nontender, BS WNL, no r/g  Results for orders placed or performed during the hospital encounter of 09/10/20 (from the past 24 hour(s))  Resp Panel by RT-PCR (Flu A&B, Covid) Nasopharyngeal Swab     Status: Abnormal   Collection Time: 09/10/20  6:04 PM   Specimen: Nasopharyngeal Swab; Nasopharyngeal(NP) swabs in vial transport medium  Result Value Ref Range   SARS Coronavirus 2 by RT PCR POSITIVE (A) NEGATIVE   Influenza A by PCR NEGATIVE NEGATIVE   Influenza B by PCR NEGATIVE NEGATIVE  Sample to Blood Bank     Status: None   Collection Time: 09/10/20  6:08 PM  Result Value Ref Range   Blood Bank Specimen SAMPLE AVAILABLE FOR TESTING    Sample Expiration      09/11/2020,2359 Performed at [facility], [address], [place], [place] [address]   Comprehensive metabolic panel     Status: Abnormal   Collection Time: 09/10/20  6:13 PM  Result Value Ref Range   Sodium 136 135 - 145 mmol/L   Potassium 3.5 3.5 - 5.1 mmol/L   Chloride 104 98 - 111 mmol/L   CO2 22 22 - 32 mmol/L   Glucose, Bld 112 (H) 70 - 99 mg/dL   BUN 9 6 - 20 mg/dL   Creatinine, Ser 1.24 0.61 - 1.24 mg/dL   Calcium 8.6 (L) 8.9  - 10.3 mg/dL   Total Protein 6.7 6.5 - 8.1 g/dL   Albumin 3.8 3.5 - 5.0 g/dL   AST 23 15 - 41 U/L   ALT 18 0 - 44 U/L   Alkaline Phosphatase 52 38 - 126 U/L   Total Bilirubin 0.6 0.3 - 1.2 mg/dL   GFR, Estimated >58 >09 mL/min   Anion gap 10 5 - 15  I-Stat Chem 8, ED     Status: Abnormal   Collection Time: 09/10/20  6:13 PM  Result Value Ref Range   Sodium 139 135 - 145 mmol/L   Potassium 3.6 3.5 - 5.1 mmol/L   Chloride 104 98 - 111 mmol/L   BUN 9 6 - 20 mg/dL   Creatinine, Ser 9.83 (H) 0.61 - 1.24 mg/dL   Glucose, Bld 382 (H) 70 - 99 mg/dL   Calcium, Ion 5.05 (L) 1.15 - 1.40 mmol/L   TCO2 22 22 - 32 mmol/L   Hemoglobin 14.6 13.0 - 17.0 g/dL   HCT 39.7 67.3 - 41.9 %  CBC     Status: Abnormal   Collection Time: 09/10/20  6:13 PM  Result Value Ref  Range   WBC 12.5 (H) 4.0 - 10.5 K/uL   RBC 4.72 4.22 - 5.81 MIL/uL   Hemoglobin 14.1 13.0 - 17.0 g/dL   HCT 16.143.9 09.639.0 - 04.552.0 %   MCV 93.0 80.0 - 100.0 fL   MCH 29.9 26.0 - 34.0 pg   MCHC 32.1 30.0 - 36.0 g/dL   RDW 40.911.9 81.111.5 - 91.415.5 %   Platelets 325 150 - 400 K/uL   nRBC 0.0 0.0 - 0.2 %  Ethanol     Status: Abnormal   Collection Time: 09/10/20  6:13 PM  Result Value Ref Range   Alcohol, Ethyl (B) 343 (HH) <10 mg/dL  Lactic acid, plasma     Status: Abnormal   Collection Time: 09/10/20  6:13 PM  Result Value Ref Range   Lactic Acid, Venous 2.3 (HH) 0.5 - 1.9 mmol/L  Protime-INR     Status: None   Collection Time: 09/10/20  6:13 PM  Result Value Ref Range   Prothrombin Time 12.1 11.4 - 15.2 seconds   INR 0.9 0.8 - 1.2  HIV Antibody (routine testing w rflx)     Status: None   Collection Time: 09/10/20  6:13 PM  Result Value Ref Range   HIV Screen 4th Generation wRfx Non Reactive Non Reactive  Magnesium     Status: None   Collection Time: 09/10/20  6:13 PM  Result Value Ref Range   Magnesium 2.2 1.7 - 2.4 mg/dL  Phosphorus     Status: None   Collection Time: 09/10/20  6:13 PM  Result Value Ref Range   Phosphorus 3.1 2.5 -  4.6 mg/dL  Urinalysis, Routine w reflex microscopic Urine, Catheterized     Status: Abnormal   Collection Time: 09/10/20  6:38 PM  Result Value Ref Range   Color, Urine STRAW (A) YELLOW   APPearance CLEAR CLEAR   Specific Gravity, Urine 1.012 1.005 - 1.030   pH 6.0 5.0 - 8.0   Glucose, UA NEGATIVE NEGATIVE mg/dL   Hgb urine dipstick NEGATIVE NEGATIVE   Bilirubin Urine NEGATIVE NEGATIVE   Ketones, ur NEGATIVE NEGATIVE mg/dL   Protein, ur NEGATIVE NEGATIVE mg/dL   Nitrite NEGATIVE NEGATIVE   Leukocytes,Ua NEGATIVE NEGATIVE  Rapid urine drug screen (hospital performed)     Status: Abnormal   Collection Time: 09/10/20  6:38 PM  Result Value Ref Range   Opiates NONE DETECTED NONE DETECTED   Cocaine NONE DETECTED NONE DETECTED   Benzodiazepines NONE DETECTED NONE DETECTED   Amphetamines NONE DETECTED NONE DETECTED   Tetrahydrocannabinol POSITIVE (A) NONE DETECTED   Barbiturates NONE DETECTED NONE DETECTED  MRSA PCR Screening     Status: None   Collection Time: 09/10/20  8:15 PM   Specimen: Nasal Mucosa; Nasopharyngeal  Result Value Ref Range   MRSA by PCR NEGATIVE NEGATIVE  CBC     Status: Abnormal   Collection Time: 09/11/20  4:33 AM  Result Value Ref Range   WBC 12.3 (H) 4.0 - 10.5 K/uL   RBC 4.45 4.22 - 5.81 MIL/uL   Hemoglobin 13.7 13.0 - 17.0 g/dL   HCT 78.240.2 95.639.0 - 21.352.0 %   MCV 90.3 80.0 - 100.0 fL   MCH 30.8 26.0 - 34.0 pg   MCHC 34.1 30.0 - 36.0 g/dL   RDW 08.612.3 57.811.5 - 46.915.5 %   Platelets 269 150 - 400 K/uL   nRBC 0.0 0.0 - 0.2 %  Basic metabolic panel     Status: Abnormal   Collection Time: 09/11/20  4:33 AM  Result Value Ref Range   Sodium 144 135 - 145 mmol/L   Potassium 4.1 3.5 - 5.1 mmol/L   Chloride 113 (H) 98 - 111 mmol/L   CO2 21 (L) 22 - 32 mmol/L   Glucose, Bld 111 (H) 70 - 99 mg/dL   BUN 7 6 - 20 mg/dL   Creatinine, Ser 4.76 0.61 - 1.24 mg/dL   Calcium 8.7 (L) 8.9 - 10.3 mg/dL   GFR, Estimated >54 >65 mL/min   Anion gap 10 5 - 15    Assessment &  Plan: Present on Admission:  ICH (intracerebral hemorrhage) (HCC)  Small bilateral SAH, evolving intraparenchymal ctx, small SDH; no mass effect - NSGY consult pending per Dr. Bedelia Person. Repeat head ct this morning shows evolving findings L inf parietal bone fx; Temporal bone fx, crossing middle ear, possible sphenoid sinus fx - ENT consult pending per Dr. Bedelia Person - Dr. Chaney Malling skull fx: CTA shows no gross traumatic vascular injury; motion artifact seen PPx: SCDs; chemical dvt ppx once cleared for this by nsgy Dispo: ICU today for closing monitoring/frequent neuro exams at interval per nsgy. He was asking for his phone this morning to call someone 'to come pick him up.' I offered to speak with his family. He is currently A&O x3 and states he will talk with them and did not want me to call them.  I spent time explaining to him why he is here, severity of his injuries. He expressed understanding. If he were to decide to leave AMA, would defer to neurosurgery if he has ability to make this decision on his own.   LOS: 1 day   Additional comments:I reviewed the patient's new clinical lab test results. cbc,bmp  Critical Care Total Time*: 60 Minutes  Marin Olp, MD [facility] Use [email or web address] to contact on call provider  09/11/2020  *Care during the described time interval was provided by me. I have reviewed this patient's available data, including medical history, events of note, physical examination and test results as part of my evaluation.

## 2020-09-11 NOTE — Consult Note (Signed)
Reason for Consult: left temporal bone fracture Referring Physician: trauma surgery  Elijah [name] is an 25 y.o. male.  HPI:  [name] Seat down steps and found unconscious.  I was called due to temporal bone fracture and bloody otorrhea  No past medical history on file.  No family history on file.  Social History:  has no history on file for tobacco use, alcohol use, and drug use.  Allergies: No Known Allergies  Medications:  I have reviewed the patient's current medications. Prior to Admission:  No medications prior to admission.    Results for orders placed or performed during the hospital encounter of 09/10/20 (from the past 48 hour(s))  Resp Panel by RT-PCR (Flu A&B, Covid) Nasopharyngeal Swab     Status: Abnormal   Collection Time: 09/10/20  6:04 PM   Specimen: Nasopharyngeal Swab; Nasopharyngeal(NP) swabs in vial transport medium  Result Value Ref Range   SARS Coronavirus 2 by RT PCR POSITIVE (A) NEGATIVE    Comment: RESULT CALLED TO, READ BACK BY AND VERIFIED WITH: RN SARAH B. [phone] FCP (NOTE) SARS-CoV-2 target nucleic acids are DETECTED.  The SARS-CoV-2 RNA is generally detectable in upper respiratory specimens during the acute phase of infection. Positive results are indicative of the presence of the identified virus, but do not rule out bacterial infection or co-infection with other pathogens not detected by the test. Clinical correlation with patient history and other diagnostic information is necessary to determine patient infection status. The expected result is Negative.  Fact Sheet for Patients: BloggerCourse.com  Fact Sheet for Healthcare Providers: SeriousBroker.it  This test is not yet approved or cleared by the [place] FDA and  has been authorized for detection and/or diagnosis of SARS-CoV-2 by FDA under an Emergency Use Authorization (EUA).  This EUA will remain in effect (meaning this test can  be used)  for the duration of  the COVID-19 declaration under Section 564(b)(1) of the Act, 21 U.S.C. section 360bbb-3(b)(1), unless the authorization is terminated or revoked sooner.     Influenza A by PCR NEGATIVE NEGATIVE   Influenza B by PCR NEGATIVE NEGATIVE    Comment: (NOTE) The Xpert Xpress SARS-CoV-2/FLU/RSV plus assay is intended as an aid in the diagnosis of influenza from Nasopharyngeal swab specimens and should not be used as a sole basis for treatment. Nasal washings and aspirates are unacceptable for Xpert Xpress SARS-CoV-2/FLU/RSV testing.  Fact Sheet for Patients: BloggerCourse.com  Fact Sheet for Healthcare Providers: SeriousBroker.it  This test is not yet approved or cleared by the [place] FDA and has been authorized for detection and/or diagnosis of SARS-CoV-2 by FDA under an Emergency Use Authorization (EUA). This EUA will remain in effect (meaning this test can be used) for the duration of the COVID-19 declaration under Section 564(b)(1) of the Act, 21 U.S.C. section 360bbb-3(b)(1), unless the authorization is terminated or revoked.  Performed at [facility], [address] [place], [place] [address]   Sample to Blood Bank     Status: None   Collection Time: 09/10/20  6:08 PM  Result Value Ref Range   Blood Bank Specimen SAMPLE AVAILABLE FOR TESTING    Sample Expiration      09/11/2020,2359 Performed at [facility], [address], [place], [place] [address]   ABO/Rh     Status: None   Collection Time: 09/10/20  6:08 PM  Result Value Ref Range   ABO/RH(D)      O POS Performed at [facility], [address], [place], [place] [address]   Comprehensive metabolic panel     Status: Abnormal   Collection Time: 09/10/20  6:13 PM  Result Value Ref Range   Sodium 136 135 - 145 mmol/L   Potassium 3.5 3.5 - 5.1 mmol/L   Chloride 104 98 - 111 mmol/L   CO2 22 22 - 32 mmol/L    Glucose, Bld 112 (H) 70 - 99 mg/dL    Comment: Glucose reference range applies only to samples taken after fasting for at least 8 hours.   BUN 9 6 - 20 mg/dL   Creatinine, Ser 6.27 0.61 - 1.24 mg/dL   Calcium 8.6 (L) 8.9 - 10.3 mg/dL   Total Protein 6.7 6.5 - 8.1 g/dL   Albumin 3.8 3.5 - 5.0 g/dL   AST 23 15 - 41 U/L   ALT 18 0 - 44 U/L   Alkaline Phosphatase 52 38 - 126 U/L   Total Bilirubin 0.6 0.3 - 1.2 mg/dL   GFR, Estimated >03 >50 mL/min    Comment: (NOTE) Calculated using the CKD-EPI Creatinine Equation (2021)    Anion gap 10 5 - 15    Comment: Performed at [facility], [address] [place], [place] [address]  I-Stat Chem 8, ED     Status: Abnormal   Collection Time: 09/10/20  6:13 PM  Result Value Ref Range   Sodium 139 135 - 145 mmol/L   Potassium 3.6 3.5 - 5.1 mmol/L   Chloride 104 98 - 111 mmol/L   BUN 9 6 - 20 mg/dL   Creatinine, Ser 8.29 (H) 0.61 - 1.24 mg/dL   Glucose, Bld 937 (H) 70 - 99 mg/dL    Comment: Glucose reference range applies only to samples taken after fasting for at least 8 hours.   Calcium, Ion 0.99 (L) 1.15 - 1.40 mmol/L   TCO2 22 22 - 32 mmol/L   Hemoglobin 14.6 13.0 - 17.0 g/dL   HCT 16.9 67.8 - 93.8 %  CBC     Status: Abnormal   Collection Time: 09/10/20  6:13 PM  Result Value Ref Range   WBC 12.5 (H) 4.0 - 10.5 K/uL   RBC 4.72 4.22 - 5.81 MIL/uL   Hemoglobin 14.1 13.0 - 17.0 g/dL   HCT 10.1 75.1 - 02.5 %   MCV 93.0 80.0 - 100.0 fL   MCH 29.9 26.0 - 34.0 pg   MCHC 32.1 30.0 - 36.0 g/dL   RDW 85.2 77.8 - 24.2 %   Platelets 325 150 - 400 K/uL   nRBC 0.0 0.0 - 0.2 %    Comment: Performed at [facility], [address] [place], [place] [address]  Ethanol     Status: Abnormal   Collection Time: 09/10/20  6:13 PM  Result Value Ref Range   Alcohol, Ethyl (B) 343 (HH) <10 mg/dL    Comment: CRITICAL RESULT CALLED TO, READ BACK BY AND VERIFIED WITHCherlyn Labella RN [phone] K FORSYTH (NOTE) Lowest detectable limit for serum  alcohol is 10 mg/dL.  For medical purposes only. Performed at [facility], [address] [place], [place] [address]   Lactic acid, plasma     Status: Abnormal   Collection Time: 09/10/20  6:13 PM  Result Value Ref Range   Lactic Acid, Venous 2.3 (HH) 0.5 - 1.9 mmol/L    Comment: CRITICAL RESULT CALLED TO, READ BACK BY AND VERIFIED WITHCherlyn Labella RN [phone] K FORSYTH  Performed at [facility], [address] [place], [place] [address]   Protime-INR     Status: None   Collection Time: 09/10/20  6:13 PM  Result Value Ref Range   Prothrombin Time 12.1 11.4 - 15.2 seconds   INR 0.9 0.8 - 1.2    Comment: (NOTE) INR goal varies based on device and disease states. Performed at [facility], [address] [place], [place] [address]   HIV Antibody (routine testing w rflx)     Status: None   Collection Time: 09/10/20  6:13 PM  Result Value Ref Range   HIV Screen 4th Generation wRfx Non Reactive Non Reactive    Comment: Performed at [facility], [address] [place], [place] [address]  Magnesium     Status: None   Collection Time: 09/10/20  6:13 PM  Result Value Ref Range   Magnesium 2.2 1.7 - 2.4 mg/dL    Comment: Performed at [facility], [address] [place], [place] [address]  Phosphorus     Status: None   Collection Time: 09/10/20  6:13 PM  Result Value Ref Range   Phosphorus 3.1 2.5 - 4.6 mg/dL    Comment: Performed at [facility], [address] [place], [place] [address]  Urinalysis, Routine w reflex microscopic Urine, Catheterized     Status: Abnormal   Collection Time: 09/10/20  6:38 PM  Result Value Ref Range   Color, Urine STRAW (A) YELLOW   APPearance CLEAR CLEAR   Specific Gravity, Urine 1.012 1.005 - 1.030   pH 6.0 5.0 - 8.0   Glucose, UA NEGATIVE NEGATIVE mg/dL   Hgb urine dipstick NEGATIVE NEGATIVE   Bilirubin Urine NEGATIVE NEGATIVE   Ketones, ur NEGATIVE NEGATIVE mg/dL   Protein, ur NEGATIVE NEGATIVE  mg/dL   Nitrite NEGATIVE NEGATIVE   Leukocytes,Ua NEGATIVE NEGATIVE    Comment: Performed at [facility], [address] [place], [place] [address]  Rapid urine drug screen (hospital performed)     Status: Abnormal   Collection Time: 09/10/20  6:38 PM  Result Value Ref Range   Opiates NONE DETECTED NONE DETECTED   Cocaine NONE DETECTED NONE DETECTED   Benzodiazepines NONE DETECTED NONE DETECTED   Amphetamines NONE DETECTED NONE DETECTED   Tetrahydrocannabinol POSITIVE (A) NONE DETECTED   Barbiturates NONE DETECTED NONE DETECTED    Comment: (NOTE) DRUG SCREEN FOR MEDICAL PURPOSES ONLY.  IF CONFIRMATION IS NEEDED FOR ANY PURPOSE, NOTIFY LAB WITHIN 5 DAYS.  LOWEST DETECTABLE LIMITS FOR URINE DRUG SCREEN Drug Class                     Cutoff (ng/mL) Amphetamine and metabolites    1000 Barbiturate and metabolites    200 Benzodiazepine                 200 Tricyclics and metabolites     300 Opiates and metabolites        300 Cocaine and metabolites        300 THC                            50 Performed at [facility], [address] [place], [place] [address]   MRSA PCR Screening     Status: None   Collection Time: 09/10/20  8:15 PM   Specimen: Nasal Mucosa; Nasopharyngeal  Result Value Ref Range  MRSA by PCR NEGATIVE NEGATIVE    Comment:        The GeneXpert MRSA Assay (FDA approved for NASAL specimens only), is one component of a comprehensive MRSA colonization surveillance program. It is not intended to diagnose MRSA infection nor to guide or monitor treatment for MRSA infections. Performed at [facility], [address] [place], [place] [address]   CBC     Status: Abnormal   Collection Time: 09/11/20  4:33 AM  Result Value Ref Range   WBC 12.3 (H) 4.0 - 10.5 K/uL   RBC 4.45 4.22 - 5.81 MIL/uL   Hemoglobin 13.7 13.0 - 17.0 g/dL   HCT 11.9 14.7 - 82.9 %   MCV 90.3 80.0 - 100.0 fL   MCH 30.8 26.0 - 34.0 pg   MCHC 34.1 30.0 - 36.0 g/dL    RDW 56.2 13.0 - 86.5 %   Platelets 269 150 - 400 K/uL   nRBC 0.0 0.0 - 0.2 %    Comment: Performed at [facility], [address], [place], [place] [address]  Basic metabolic panel     Status: Abnormal   Collection Time: 09/11/20  4:33 AM  Result Value Ref Range   Sodium 144 135 - 145 mmol/L   Potassium 4.1 3.5 - 5.1 mmol/L   Chloride 113 (H) 98 - 111 mmol/L   CO2 21 (L) 22 - 32 mmol/L   Glucose, Bld 111 (H) 70 - 99 mg/dL    Comment: Glucose reference range applies only to samples taken after fasting for at least 8 hours.   BUN 7 6 - 20 mg/dL   Creatinine, Ser 6.29 0.61 - 1.24 mg/dL   Calcium 8.7 (L) 8.9 - 10.3 mg/dL   GFR, Estimated >52 >84 mL/min    Comment: (NOTE) Calculated using the CKD-EPI Creatinine Equation (2021)    Anion gap 10 5 - 15    Comment: Performed at [facility], [address], [place], [place] [address]  Type and screen [facility]     Status: None   Collection Time: 09/11/20  8:06 AM  Result Value Ref Range   ABO/RH(D) O POS    Antibody Screen NEG    Sample Expiration      09/14/2020,2359 Performed at [facility], [address], [place], [place] [address]     CT HEAD WO CONTRAST  Result Date: 09/11/2020 CLINICAL DATA:  Traumatic intracranial hemorrhage, follow-up EXAM: CT HEAD WITHOUT CONTRAST TECHNIQUE: Contiguous axial images were obtained from the base of the skull through the vertex without intravenous contrast. COMPARISON:  09/10/2020 FINDINGS: Brain: Bilateral sulcal subarachnoid hemorrhage is again identified. Suspect trace hemorrhage layering within the left occipital horn and interpeduncular cistern. There are likely adjacent hemorrhagic parenchymal contusions in the inferolateral right temporal lobe, right perisylvian convexity, and superolateral left temporal lobe (series 3, images 12, 18, and 16). These are likely evolving as expected with increased hemorrhage. Minor subdural hemorrhage is present along the  posterior falx and tentorium. Gray-white differentiation is preserved. There is no hydrocephalus. No significant mass effect. Vascular: No new findings. Skull: Left calvarial fracture. Sinuses/Orbits: No new findings. Other: No new findings. IMPRESSION: Similar small volume bilateral sulcal subarachnoid hemorrhage. Increased visibility of evolving small bilateral hemorrhagic parenchymal contusions. Minor falcine and tentorial subdural hemorrhage. No significant mass effect.  No hydrocephalus. Electronically Signed   By: Guadlupe Spanish M.D.   On: 09/11/2020 08:23   CT Head Wo Contrast  Result Date: 09/10/2020 CLINICAL DATA:  Multi trauma EXAM: CT HEAD WITHOUT CONTRAST TECHNIQUE: Contiguous axial images were obtained from the base of the skull through the vertex without intravenous contrast. COMPARISON:  None. FINDINGS: Brain: There is traumatic subarachnoid hemorrhage on both sides, more voluminous on the right than the left. No definite intraparenchymal hematoma. One could question early evidence of a minimal hemorrhagic contusion of the inferior right temporal lobe just superior to the temporal bone. No large intraparenchymal hematoma. There is a thin subdural hematoma along the lateral aspect of the middle cranial fossa on the left. I do not see a finding to in bow the potential of epidural hematoma. No sign of ischemic infarction. No hydrocephalus. Small amount of pneumocephalus on the left associated with fractures described below. Vascular: No primary vascular lesion. Skull: Multiple fractures of the petrous and squamous temporal bones on the left. Longitudinal fracture of the left temporal bone traversing the middle ear. Definite ossicular disruption is not demonstrated. Fluid in the middle ear and mastoid air cells. No definite otic capsule breach. Consider detailed temporal bone study. Fracture lines do extend to the carotid canal. Consider CT angiography. Fractures of the squamous portion of the  temporal bone include a minimally displaced fragment. Nondepressed fracture of the inferior left parietal bone. Sinuses/Orbits: Frontal, ethmoid and maxillary sinuses are clear. Air-fluid level in the sphenoid sinus related to a minimal fracture at the left lateral wall and floor. Other: None IMPRESSION: 1. Traumatic subarachnoid hemorrhage on both sides, more voluminous on the right than the left. No definite intraparenchymal hematoma. One could question early evidence of a minimal hemorrhagic contusion of the inferior right temporal lobe just superior to the temporal bone. 2. Thin subdural hematoma along the lateral aspect of the middle cranial fossa on the left. No mass effect upon the brain. 3. Multiple fractures of the petrous and squamous temporal bones on the left. Longitudinal fracture of the left temporal bone traversing the middle ear. Definite ossicular disruption is not demonstrated. Fluid in the middle ear and mastoid air cells. No definite otic capsule breach. Consider detailed temporal bone study. Fracture lines extend to the carotid canal. Consider CT angiography. 4. Nondepressed fracture of the inferior left parietal bone. 5. Air-fluid level in the sphenoid sinus related to a minimal fracture at the left lateral wall and floor. Electronically Signed   By: Paulina Fusi M.D.   On: 09/10/2020 19:02   CT ANGIO NECK W OR WO CONTRAST  Result Date: 09/11/2020 CLINICAL DATA:  Fall with intracranial hemorrhage and skull fracture EXAM: CT ANGIOGRAPHY NECK TECHNIQUE: Multidetector CT imaging of the neck was performed using the standard protocol during bolus administration of intravenous contrast. Multiplanar CT image reconstructions and MIPs were obtained to evaluate the vascular anatomy. Carotid stenosis measurements (when applicable) are obtained utilizing NASCET criteria, using the distal internal carotid diameter as the denominator. CONTRAST:  75mL OMNIPAQUE IOHEXOL 350 MG/ML SOLN, 60mL OMNIPAQUE  IOHEXOL 350 MG/ML SOLN COMPARISON:  None. FINDINGS: Motion artifact is present.  Poor contrast bolus timing. Aortic arch: Great vessel origins appear patent with artifact obscuring much of the innominate. Right carotid system: Grossly patent without evidence of traumatic injury. Left carotid system: Grossly patent without evidence of traumatic injury. Visualized intracranial portion at the skull base is poorly evaluated. Vertebral arteries: Grossly patent without evidence of traumatic injury. Skeleton: Better evaluated on prior cervical spine imaging. Other neck: Infiltration of the left supraclavicular fossa fat is likely posttraumatic. Upper chest: Refer to prior chest CT.  IMPRESSION: Poor evaluation due to motion artifact and contrast bolus timing. There is grossly no evidence of traumatic vascular injury, but portions of the study are nondiagnostic. Electronically Signed   By: Guadlupe Spanish M.D.   On: 09/11/2020 08:41   CT Cervical Spine Wo Contrast  Result Date: 09/10/2020 CLINICAL DATA:  Multi trauma.  Attended. EXAM: CT CERVICAL SPINE WITHOUT CONTRAST TECHNIQUE: Multidetector CT imaging of the cervical spine was performed without intravenous contrast. Multiplanar CT image reconstructions were also generated. COMPARISON:  None. FINDINGS: Alignment: Normal Skull base and vertebrae: No evidence of cervical spine fracture. See results of head CT for multiple fractures. Soft tissues and spinal canal: Neck soft tissues are negative. Disc levels: Very minimal degenerative spondylosis C4-5 and C5-6. No stenosis. Upper chest: Clear Other: None IMPRESSION: No traumatic cervical finding. Very minimal degenerative spondylosis C4-5 and C5-6. See results of head CT for multiple fractures described in that report. Electronically Signed   By: Paulina Fusi M.D.   On: 09/10/2020 19:04   DG Pelvis Portable  Result Date: 09/10/2020 CLINICAL DATA:  Pain status post fall EXAM: PORTABLE PELVIS 1-2 VIEWS COMPARISON:  None.  FINDINGS: There is no evidence of pelvic fracture or diastasis. No pelvic bone lesions are seen. IMPRESSION: Negative. Electronically Signed   By: Katherine Mantle M.D.   On: 09/10/2020 18:26   CT CHEST ABDOMEN PELVIS W CONTRAST  Result Date: 09/10/2020 CLINICAL DATA:  Multi trauma EXAM: CT CHEST, ABDOMEN, AND PELVIS WITH CONTRAST TECHNIQUE: Multidetector CT imaging of the chest, abdomen and pelvis was performed following the standard protocol during bolus administration of intravenous contrast. CONTRAST:  OMNIPAQUE IOHEXOL 300 MG/ML  SOLN COMPARISON:  None. FINDINGS: CT CHEST FINDINGS Cardiovascular: Heart size is normal. No pericardial fluid. No evidence of vascular injury in the chest. Mediastinum/Nodes: No evidence of mediastinal hemorrhage. No mass or adenopathy. Lungs/Pleura: No pneumothorax or hemothorax. Lungs are clear except for a very tiny emphysematous bleb at the right apex. No evidence of contusion or aspiration. Musculoskeletal: No fracture of the thoracic spine, ribs or sternum. CT ABDOMEN PELVIS FINDINGS Hepatobiliary: Normal Pancreas: Normal Spleen: Normal Adrenals/Urinary Tract: Adrenal glands are normal. Kidneys are normal. Bladder is normal. Stomach/Bowel: Normal Vascular/Lymphatic: Normal Reproductive: Normal Other: No free fluid or air. Musculoskeletal: No lumbar spine, pelvic or hip fracture. IMPRESSION: Normal CT scan of the chest, abdomen and pelvis. No traumatic finding. Electronically Signed   By: Paulina Fusi M.D.   On: 09/10/2020 19:08   DG Chest Port 1 View  Result Date: 09/10/2020 CLINICAL DATA:  Pain status post fall EXAM: PORTABLE CHEST 1 VIEW COMPARISON:  None. FINDINGS: The heart size and mediastinal contours are within normal limits. Both lungs are clear. The visualized skeletal structures are unremarkable. IMPRESSION: No active disease. Electronically Signed   By: Katherine Mantle M.D.   On: 09/10/2020 18:28   CT MAXILLOFACIAL WO CONTRAST  Addendum Date:  09/11/2020   ADDENDUM REPORT: 09/11/2020 08:42 ADDENDUM: As stated in the findings, there is mild irregularity of the roof of the sphenoid sinus. A nondisplaced fracture is possible. Electronically Signed   By: Guadlupe Spanish M.D.   On: 09/11/2020 08:42   Result Date: 09/11/2020 CLINICAL DATA:  Facial trauma EXAM: CT MAXILLOFACIAL WITHOUT CONTRAST TECHNIQUE: Multidetector CT imaging of the maxillofacial structures was performed. Multiplanar CT image reconstructions were also generated. COMPARISON:  None. FINDINGS: Osseous: Calvarial fracture involving temporal bone as described elsewhere. Mild irregularity along the roof of the left sphenoid sinus without definite fracture. No  acute facial fracture. Temporomandibular joints are intact. Orbits: No intraorbital hematoma. Sinuses: Mild mucosal thickening. Retained secretions or debris within the sphenoid sinuses. Soft tissues: No large facial hematoma. Limited intracranial: Dictated separately. IMPRESSION: No acute facial fracture. Electronically Signed: By: Guadlupe Spanish M.D. On: 09/11/2020 08:32   CT TEMPORAL BONES WO CONTRAST  Result Date: 09/11/2020 CLINICAL DATA:  Temporal bone fracture EXAM: CT TEMPORAL BONES WITHOUT CONTRAST TECHNIQUE: Axial and coronal plane CT imaging of the petrous temporal bones was performed with thin-collimation image reconstruction. No intravenous contrast was administered. Multiplanar CT image reconstructions were also generated. COMPARISON:  None. FINDINGS: Right temporal bone: External auditory canal and tympanic membrane are unremarkable. Middle ear cleft and ossicles are unremarkable. Cochlea and semicircular canals are unremarkable. Tegmen is intact. Facial nerve demonstrates normal course. Mastoid air cells are clear. Left temporal bone: As seen on prior head CT, there is a fracture of the left parietal calvarium extending into the squamosal and mastoid portions of the temporal bone. Fracture extends through the posterior  and anterior walls of the external auditory canal, which is partially opacified. Partial opacification of the mastoid air cells. Tympanic membrane is not well evaluated. Partial opacification of the middle ear cleft. Ossicles appear intact. Cochlea and semicircular canals are unremarkable. Tegmen appears intact. Facial nerve demonstrates normal course. IMPRESSION: Fracture involves squamosal and mastoid portions of the left temporal bone. Otic capsule is spared. There is involvement of anterior and posterior walls of the external auditory canal. Partial opacification of mastoid air cells, middle ear, and external auditory canal, which may reflect a combination of fluid and hemorrhage. Electronically Signed   By: Guadlupe Spanish M.D.   On: 09/11/2020 08:15    Review of Systems Blood pressure 127/74, pulse 65, temperature 98.5 F (36.9 C), temperature source Oral, resp. rate 17, height 6' (1.829 m), weight 79.5 kg, SpO2 98 %. Physical Exam  Facial nerve function intact bilaterally Bloody otorrhea left ear No nystagmus  Assessment/Plan:  Left horizontal temporal bone fracture  Place Ciprodex into left ear twice daily x 1 week to prevent blood from drying in canal Follow up ENT after discharge for audiogram No acute intervention needed - reconsult if needed  Rejeana Brock 09/11/2020, 2:32 PM

## 2020-09-11 NOTE — Progress Notes (Addendum)
Pt's mother Larita Fife [phone]) and father Rose Phi [phone]) called and requested an update from MD tomorrow.

## 2020-09-12 ENCOUNTER — Inpatient Hospital Stay (HOSPITAL_COMMUNITY): Payer: 59

## 2020-09-12 LAB — CBC
HCT: 39.4 % (ref 39.0–52.0)
Hemoglobin: 13 g/dL (ref 13.0–17.0)
MCH: 30.2 pg (ref 26.0–34.0)
MCHC: 33 g/dL (ref 30.0–36.0)
MCV: 91.6 fL (ref 80.0–100.0)
Platelets: 256 10*3/uL (ref 150–400)
RBC: 4.3 MIL/uL (ref 4.22–5.81)
RDW: 12.2 % (ref 11.5–15.5)
WBC: 15.5 10*3/uL — ABNORMAL HIGH (ref 4.0–10.5)
nRBC: 0 % (ref 0.0–0.2)

## 2020-09-12 LAB — BASIC METABOLIC PANEL
Anion gap: 10 (ref 5–15)
BUN: 6 mg/dL (ref 6–20)
CO2: 21 mmol/L — ABNORMAL LOW (ref 22–32)
Calcium: 8.8 mg/dL — ABNORMAL LOW (ref 8.9–10.3)
Chloride: 106 mmol/L (ref 98–111)
Creatinine, Ser: 0.88 mg/dL (ref 0.61–1.24)
GFR, Estimated: >60 mL/min (ref >60)
Glucose, Bld: 103 mg/dL — ABNORMAL HIGH (ref 70–99)
Potassium: 3.8 mmol/L (ref 3.5–5.1)
Sodium: 137 mmol/L (ref 135–145)

## 2020-09-12 NOTE — Progress Notes (Signed)
   Trauma/Critical Care Follow Up Note  Subjective:    Overnight Issues:   Objective:  Vital signs for last 24 hours: Temp:  [97.6 F (36.4 C)-99.2 F (37.3 C)] 99.2 F (37.3 C) (02/22 0400) Pulse Rate:  [42-91] 70 (02/22 0800) Resp:  [15-23] 18 (02/22 0800) BP: (109-163)/(68-96) 163/90 (02/22 0800) SpO2:  [91 %-100 %] 99 % (02/22 0800)  Hemodynamic parameters for last 24 hours:    Intake/Output from previous day: 02/21 0701 - 02/22 0700 In: 3513.1 [P.O.:300; I.V.:2402.2; IV Piggyback:810.9] Out: 2045 [Urine:2045]  Intake/Output this shift: Total I/O In: 340 [P.O.:240; I.V.:100] Out: 500 [Urine:500]  Vent settings for last 24 hours:    Physical Exam:  Gen: comfortable, no distress Neuro: non-focal exam, f/c HEENT: PERRL Neck: supple CV: RRR Pulm: unlabored breathing Abd: soft, NT GU: clear yellow urine Extr: wwp, no edema   Results for orders placed or performed during the hospital encounter of 09/10/20 (from the past 24 hour(s))  CBC     Status: Abnormal   Collection Time: 09/12/20  1:38 AM  Result Value Ref Range   WBC 15.5 (H) 4.0 - 10.5 K/uL   RBC 4.30 4.22 - 5.81 MIL/uL   Hemoglobin 13.0 13.0 - 17.0 g/dL   HCT 32.2 02.5 - 42.7 %   MCV 91.6 80.0 - 100.0 fL   MCH 30.2 26.0 - 34.0 pg   MCHC 33.0 30.0 - 36.0 g/dL   RDW 06.2 37.6 - 28.3 %   Platelets 256 150 - 400 K/uL   nRBC 0.0 0.0 - 0.2 %  Basic metabolic panel     Status: Abnormal   Collection Time: 09/12/20  1:38 AM  Result Value Ref Range   Sodium 137 135 - 145 mmol/L   Potassium 3.8 3.5 - 5.1 mmol/L   Chloride 106 98 - 111 mmol/L   CO2 21 (L) 22 - 32 mmol/L   Glucose, Bld 103 (H) 70 - 99 mg/dL   BUN 6 6 - 20 mg/dL   Creatinine, Ser 1.51 0.61 - 1.24 mg/dL   Calcium 8.8 (L) 8.9 - 10.3 mg/dL   GFR, Estimated >76 >16 mL/min   Anion gap 10 5 - 15    Assessment & Plan: The plan of care was discussed with the bedside nurse for the day, Misty Stanley, who is in agreement with this plan and no  additional concerns were raised.   Present on Admission: . ICH (intracerebral hemorrhage) (HCC)    LOS: 2 days   Additional comments:I reviewed the patient's new clinical lab test results.   and I reviewed the patients new imaging test results.     Small bilateral SAH, evolving intraparenchymal ctx, small SDH; no mass effect - NSGY consult, Dr. Wynetta Emery, repeat head CT with evolving findings, keppra x7d for sz ppx, A&Ox4 L inf parietal bone fx; Temporal bone fx, crossing middle ear, possible sphenoid sinus fx - ENT consult, Dr. Elijah Birk, audiogram as o/p Basilar skull fx: CTA shows no gross traumatic vascular injury; motion artifact seen PPx: SCDs; chemical dvt ppx once cleared for this by nsgy Dispo: 4NP, repeat session with PT/OT and home today vs tomorrow.   Diamantina Monks, MD Trauma & General Surgery Please use [email or web address] to contact on call provider  09/12/2020  *Care during the described time interval was provided by me. I have reviewed this patient's available data, including medical history, events of note, physical examination and test results as part of my evaluation.

## 2020-09-12 NOTE — Progress Notes (Signed)
Lengthy clinical update provided to patient's mother via phone. Explained COVID visitation restrictions and discussed her concerns regarding care at home. PT has documented discussion with mother today of patient's current mobility, risks (lack of insight and safety awareness, unsteady on his feet), and s/s of concussion. Mother affirms that this discussion did happen, but continues to have concerns regarding patient needs and when he will no longer need 24h supervision. Informed that patient will be set up for HHPT and progress after discharge will dictate when this will be appropriate. Will plan to have PT/OT discuss again with mother after patient's session tomorrow in anticipation of discharge 2/23.  Diamantina Monks, MD General and Trauma Surgery [facility]

## 2020-09-12 NOTE — Progress Notes (Signed)
Physical Therapy Treatment Patient Details Name: Elijah [name] MRN: [id number] DOB: [birth year] Today's Date: 09/12/2020    History of Present Illness 25 yo male presenting via EMS after being found down at bottom of stairs. Head CT showing [facility] bilaterally (R>L).  L inf parietal bone fx; Temporal bone fx, crossing middle ear, possible sphenoid sinus fx. C-spine cleared via imaging, but not tested clinically yet secondary to pt cognitive status.Tested COVID-19 + on 2/20. No significant PMH noted.    PT Comments    Pt demonstrating cognitive improvement today vs yesterday, as pt A&Ox4, follows simple commands well. Pt continues to lack insight into deficits, demonstrate difficulty with higher level tasks (I.e. failed money management questsions), and lacks safety awareness during mobility. Pt ambulatory in room with min assist overall for steadying, pt stating he feels "back to normal" but requires external assist from PT to maintain balance and navigate room. This PT called pt's mother to discuss pt's current mobility and cognition, as well as s/s of concussion for management at home. Both pt and mother would like pt to d/c home, PT updated recommendation to reflect home with HHPT and supervision from mother, sister, and other family members.    Follow Up Recommendations  Home health PT;Supervision/Assistance - 24 hour     Equipment Recommendations  None recommended by PT    Recommendations for Other Services       Precautions / Restrictions Precautions Precautions: Fall;Other (comment) (Rancho V) Required Braces or Orthoses: Other Brace Other Brace: C-spine cleared, no collar needs Restrictions Weight Bearing Restrictions: No    Mobility  Bed Mobility Overal bed mobility: Needs Assistance Bed Mobility: Supine to Sit;Sit to Supine     Supine to sit: Supervision Sit to supine: Supervision   General bed mobility comments: supervision for safety, increased time and effort.     Transfers Overall transfer level: Needs assistance Equipment used: 1 person hand held assist Transfers: Sit to/from Stand Sit to Stand: Min assist         General transfer comment: min assist for steadying upon standing, pt with posterior truncal leaning preference.  Ambulation/Gait Ambulation/Gait assistance: Min assist Gait Distance (Feet): 40 Feet Assistive device: None Gait Pattern/deviations: Step-through pattern;Decreased stride length;Wide base of support Gait velocity: decr   General Gait Details: min assist to steady, guide pt trajectory. Pt bumping into walls and room objects if not guided by PT. wide BOS slightly ataxic in appearance, improved vs yesterday.   Stairs             Wheelchair Mobility    Modified Rankin (Stroke Patients Only)       Balance Overall balance assessment: Needs assistance Sitting-balance support: No upper extremity supported;Feet supported Sitting balance-Leahy Scale: Fair     Standing balance support: During functional activity;No upper extremity supported Standing balance-Leahy Scale: Fair Standing balance comment: ambulatory without AD                            Cognition Arousal/Alertness: Awake/alert Behavior During Therapy: Flat affect Overall Cognitive Status: Impaired/Different from baseline Area of Impairment: Attention;Memory;Following commands               Rancho Levels of Cognitive Functioning Ranch[place] Amigos Scales of Cognitive Functioning: Confused/appropriate   Current Attention Level: Selective Memory: Decreased short-term memory Following Commands: Follows one step commands with increased time Safety/Judgement: Decreased awareness of deficits Awareness: Emergent Problem Solving: Slow processing;Decreased initiation;Difficulty sequencing;Requires verbal cues;Requires tactile  cues General Comments: Pt A&Ox4 today, does not recall PT from yesterday and does not recall calling mother  multiple times yesterday (per mother, 30 phone calls). Pt lacks insight into deficits, as is pretty unsteady but states he feels back to baseline. Pt bumping into walls and objects in room if not cued for room navigation.      Exercises Other Exercises Other Exercises: STS x5, for LE strengthening and to ensure direction-following Other Exercises: PT education: s/s concussion (dizziness, nausea, irritability, photosensitivity) and management of s/s (decreasing light, screen time, rest). Pt and mother educated    General Comments        Pertinent Vitals/Pain Pain Assessment: Faces Faces Pain Scale: Hurts even more Pain Location: L shoulder (TTP along clavicle) Pain Descriptors / Indicators: Discomfort;Grimacing;Guarding Pain Intervention(s): Limited activity within patient's tolerance;Monitored during session;Repositioned;Other (comment) (RN notified)    Home Living                      Prior Function            PT Goals (current goals can now be found in the care plan section) Acute Rehab PT Goals Patient Stated Goal: go home PT Goal Formulation: With patient Time For Goal Achievement: 09/25/20 Potential to Achieve Goals: Good Progress towards PT goals: Progressing toward goals    Frequency    Min 4X/week      PT Plan Discharge plan needs to be updated    Co-evaluation              AM-PAC PT "6 Clicks" Mobility   Outcome Measure  Help needed turning from your back to your side while in a flat bed without using bedrails?: A Little Help needed moving from lying on your back to sitting on the side of a flat bed without using bedrails?: A Little Help needed moving to and from a bed to a chair (including a wheelchair)?: A Little Help needed standing up from a chair using your arms (e.g., wheelchair or bedside chair)?: A Little Help needed to walk in hospital room?: A Little Help needed climbing 3-5 steps with a railing? : A Lot 6 Click Score: 17     End of Session   Activity Tolerance: Patient limited by fatigue Patient left: in bed;with call bell/phone within reach;with bed alarm set Nurse Communication: Mobility status PT Visit Diagnosis: Difficulty in walking, not elsewhere classified (R26.2);Other symptoms and signs involving the nervous system (R29.898);Unsteadiness on feet (R26.81)     Time: 1114-1140 PT Time Calculation (min) (ACUTE ONLY): 26 min  Charges:  $Gait Training: 8-22 mins $Self Care/Home Management: 8-22                     Marye Round, PT Acute Rehabilitation Services Pager [phone]  Office [phone]    Tyrone Apple E Christain [place] 09/12/2020, 2:22 PM

## 2020-09-12 NOTE — Progress Notes (Addendum)
Subjective: Patient reports Feeling better no headache no neck pain  Objective: Vital signs in last 24 hours: Temp:  [97.6 F (36.4 C)-99.2 F (37.3 C)] 99.2 F (37.3 C) (02/22 0400) Pulse Rate:  [42-91] 45 (02/22 0700) Resp:  [15-23] 16 (02/22 0700) BP: (109-148)/(67-96) 129/80 (02/22 0700) SpO2:  [91 %-100 %] 97 % (02/22 0700)  Intake/Output from previous day: 02/21 0701 - 02/22 0700 In: 3513.1 [P.O.:300; I.V.:2402.2; IV Piggyback:810.9] Out: 2045 [Urine:2045] Intake/Output this shift: No intake/output data recorded.  Awake alert oriented strength 5-5  Lab Results: Recent Labs    09/11/20 0433 09/12/20 0138  WBC 12.3* 15.5*  HGB 13.7 13.0  HCT 40.2 39.4  PLT 269 256   BMET Recent Labs    09/11/20 0433 09/12/20 0138  NA 144 137  K 4.1 3.8  CL 113* 106  CO2 21* 21*  GLUCOSE 111* 103*  BUN 7 6  CREATININE 0.84 0.88  CALCIUM 8.7* 8.8*    Studies/Results: CT HEAD WO CONTRAST  Result Date: 09/11/2020 CLINICAL DATA:  Traumatic intracranial hemorrhage, follow-up EXAM: CT HEAD WITHOUT CONTRAST TECHNIQUE: Contiguous axial images were obtained from the base of the skull through the vertex without intravenous contrast. COMPARISON:  09/10/2020 FINDINGS: Brain: Bilateral sulcal subarachnoid hemorrhage is again identified. Suspect trace hemorrhage layering within the left occipital horn and interpeduncular cistern. There are likely adjacent hemorrhagic parenchymal contusions in the inferolateral right temporal lobe, right perisylvian convexity, and superolateral left temporal lobe (series 3, images 12, 18, and 16). These are likely evolving as expected with increased hemorrhage. Minor subdural hemorrhage is present along the posterior falx and tentorium. Gray-white differentiation is preserved. There is no hydrocephalus. No significant mass effect. Vascular: No new findings. Skull: Left calvarial fracture. Sinuses/Orbits: No new findings. Other: No new findings. IMPRESSION:  Similar small volume bilateral sulcal subarachnoid hemorrhage. Increased visibility of evolving small bilateral hemorrhagic parenchymal contusions. Minor falcine and tentorial subdural hemorrhage. No significant mass effect.  No hydrocephalus. Electronically Signed   By: Guadlupe Spanish M.D.   On: 09/11/2020 08:23   CT Head Wo Contrast  Result Date: 09/10/2020 CLINICAL DATA:  Multi trauma EXAM: CT HEAD WITHOUT CONTRAST TECHNIQUE: Contiguous axial images were obtained from the base of the skull through the vertex without intravenous contrast. COMPARISON:  None. FINDINGS: Brain: There is traumatic subarachnoid hemorrhage on both sides, more voluminous on the right than the left. No definite intraparenchymal hematoma. One could question early evidence of a minimal hemorrhagic contusion of the inferior right temporal lobe just superior to the temporal bone. No large intraparenchymal hematoma. There is a thin subdural hematoma along the lateral aspect of the middle cranial fossa on the left. I do not see a finding to in bow the potential of epidural hematoma. No sign of ischemic infarction. No hydrocephalus. Small amount of pneumocephalus on the left associated with fractures described below. Vascular: No primary vascular lesion. Skull: Multiple fractures of the petrous and squamous temporal bones on the left. Longitudinal fracture of the left temporal bone traversing the middle ear. Definite ossicular disruption is not demonstrated. Fluid in the middle ear and mastoid air cells. No definite otic capsule breach. Consider detailed temporal bone study. Fracture lines do extend to the carotid canal. Consider CT angiography. Fractures of the squamous portion of the temporal bone include a minimally displaced fragment. Nondepressed fracture of the inferior left parietal bone. Sinuses/Orbits: Frontal, ethmoid and maxillary sinuses are clear. Air-fluid level in the sphenoid sinus related to a minimal fracture at the left  lateral wall and floor. Other: None IMPRESSION: 1. Traumatic subarachnoid hemorrhage on both sides, more voluminous on the right than the left. No definite intraparenchymal hematoma. One could question early evidence of a minimal hemorrhagic contusion of the inferior right temporal lobe just superior to the temporal bone. 2. Thin subdural hematoma along the lateral aspect of the middle cranial fossa on the left. No mass effect upon the brain. 3. Multiple fractures of the petrous and squamous temporal bones on the left. Longitudinal fracture of the left temporal bone traversing the middle ear. Definite ossicular disruption is not demonstrated. Fluid in the middle ear and mastoid air cells. No definite otic capsule breach. Consider detailed temporal bone study. Fracture lines extend to the carotid canal. Consider CT angiography. 4. Nondepressed fracture of the inferior left parietal bone. 5. Air-fluid level in the sphenoid sinus related to a minimal fracture at the left lateral wall and floor. Electronically Signed   By: Paulina Fusi M.D.   On: 09/10/2020 19:02   CT ANGIO NECK W OR WO CONTRAST  Result Date: 09/11/2020 CLINICAL DATA:  Fall with intracranial hemorrhage and skull fracture EXAM: CT ANGIOGRAPHY NECK TECHNIQUE: Multidetector CT imaging of the neck was performed using the standard protocol during bolus administration of intravenous contrast. Multiplanar CT image reconstructions and MIPs were obtained to evaluate the vascular anatomy. Carotid stenosis measurements (when applicable) are obtained utilizing NASCET criteria, using the distal internal carotid diameter as the denominator. CONTRAST:  70mL OMNIPAQUE IOHEXOL 350 MG/ML SOLN, 35mL OMNIPAQUE IOHEXOL 350 MG/ML SOLN COMPARISON:  None. FINDINGS: Motion artifact is present.  Poor contrast bolus timing. Aortic arch: Great vessel origins appear patent with artifact obscuring much of the innominate. Right carotid system: Grossly patent without evidence of  traumatic injury. Left carotid system: Grossly patent without evidence of traumatic injury. Visualized intracranial portion at the skull base is poorly evaluated. Vertebral arteries: Grossly patent without evidence of traumatic injury. Skeleton: Better evaluated on prior cervical spine imaging. Other neck: Infiltration of the left supraclavicular fossa fat is likely posttraumatic. Upper chest: Refer to prior chest CT. IMPRESSION: Poor evaluation due to motion artifact and contrast bolus timing. There is grossly no evidence of traumatic vascular injury, but portions of the study are nondiagnostic. Electronically Signed   By: Guadlupe Spanish M.D.   On: 09/11/2020 08:41   CT Cervical Spine Wo Contrast  Result Date: 09/10/2020 CLINICAL DATA:  Multi trauma.  Attended. EXAM: CT CERVICAL SPINE WITHOUT CONTRAST TECHNIQUE: Multidetector CT imaging of the cervical spine was performed without intravenous contrast. Multiplanar CT image reconstructions were also generated. COMPARISON:  None. FINDINGS: Alignment: Normal Skull base and vertebrae: No evidence of cervical spine fracture. See results of head CT for multiple fractures. Soft tissues and spinal canal: Neck soft tissues are negative. Disc levels: Very minimal degenerative spondylosis C4-5 and C5-6. No stenosis. Upper chest: Clear Other: None IMPRESSION: No traumatic cervical finding. Very minimal degenerative spondylosis C4-5 and C5-6. See results of head CT for multiple fractures described in that report. Electronically Signed   By: Paulina Fusi M.D.   On: 09/10/2020 19:04   DG Pelvis Portable  Result Date: 09/10/2020 CLINICAL DATA:  Pain status post fall EXAM: PORTABLE PELVIS 1-2 VIEWS COMPARISON:  None. FINDINGS: There is no evidence of pelvic fracture or diastasis. No pelvic bone lesions are seen. IMPRESSION: Negative. Electronically Signed   By: Katherine Mantle M.D.   On: 09/10/2020 18:26   CT CHEST ABDOMEN PELVIS W CONTRAST  Result Date:  09/10/2020 CLINICAL  DATA:  Multi trauma EXAM: CT CHEST, ABDOMEN, AND PELVIS WITH CONTRAST TECHNIQUE: Multidetector CT imaging of the chest, abdomen and pelvis was performed following the standard protocol during bolus administration of intravenous contrast. CONTRAST:  OMNIPAQUE IOHEXOL 300 MG/ML  SOLN COMPARISON:  None. FINDINGS: CT CHEST FINDINGS Cardiovascular: Heart size is normal. No pericardial fluid. No evidence of vascular injury in the chest. Mediastinum/Nodes: No evidence of mediastinal hemorrhage. No mass or adenopathy. Lungs/Pleura: No pneumothorax or hemothorax. Lungs are clear except for a very tiny emphysematous bleb at the right apex. No evidence of contusion or aspiration. Musculoskeletal: No fracture of the thoracic spine, ribs or sternum. CT ABDOMEN PELVIS FINDINGS Hepatobiliary: Normal Pancreas: Normal Spleen: Normal Adrenals/Urinary Tract: Adrenal glands are normal. Kidneys are normal. Bladder is normal. Stomach/Bowel: Normal Vascular/Lymphatic: Normal Reproductive: Normal Other: No free fluid or air. Musculoskeletal: No lumbar spine, pelvic or hip fracture. IMPRESSION: Normal CT scan of the chest, abdomen and pelvis. No traumatic finding. Electronically Signed   By: Paulina Fusi M.D.   On: 09/10/2020 19:08   DG Chest Port 1 View  Result Date: 09/10/2020 CLINICAL DATA:  Pain status post fall EXAM: PORTABLE CHEST 1 VIEW COMPARISON:  None. FINDINGS: The heart size and mediastinal contours are within normal limits. Both lungs are clear. The visualized skeletal structures are unremarkable. IMPRESSION: No active disease. Electronically Signed   By: Katherine Mantle M.D.   On: 09/10/2020 18:28   CT MAXILLOFACIAL WO CONTRAST  Addendum Date: 09/11/2020   ADDENDUM REPORT: 09/11/2020 08:42 ADDENDUM: As stated in the findings, there is mild irregularity of the roof of the sphenoid sinus. A nondisplaced fracture is possible. Electronically Signed   By: Guadlupe Spanish M.D.   On: 09/11/2020  08:42   Result Date: 09/11/2020 CLINICAL DATA:  Facial trauma EXAM: CT MAXILLOFACIAL WITHOUT CONTRAST TECHNIQUE: Multidetector CT imaging of the maxillofacial structures was performed. Multiplanar CT image reconstructions were also generated. COMPARISON:  None. FINDINGS: Osseous: Calvarial fracture involving temporal bone as described elsewhere. Mild irregularity along the roof of the left sphenoid sinus without definite fracture. No acute facial fracture. Temporomandibular joints are intact. Orbits: No intraorbital hematoma. Sinuses: Mild mucosal thickening. Retained secretions or debris within the sphenoid sinuses. Soft tissues: No large facial hematoma. Limited intracranial: Dictated separately. IMPRESSION: No acute facial fracture. Electronically Signed: By: Guadlupe Spanish M.D. On: 09/11/2020 08:32   CT TEMPORAL BONES WO CONTRAST  Result Date: 09/11/2020 CLINICAL DATA:  Temporal bone fracture EXAM: CT TEMPORAL BONES WITHOUT CONTRAST TECHNIQUE: Axial and coronal plane CT imaging of the petrous temporal bones was performed with thin-collimation image reconstruction. No intravenous contrast was administered. Multiplanar CT image reconstructions were also generated. COMPARISON:  None. FINDINGS: Right temporal bone: External auditory canal and tympanic membrane are unremarkable. Middle ear cleft and ossicles are unremarkable. Cochlea and semicircular canals are unremarkable. Tegmen is intact. Facial nerve demonstrates normal course. Mastoid air cells are clear. Left temporal bone: As seen on prior head CT, there is a fracture of the left parietal calvarium extending into the squamosal and mastoid portions of the temporal bone. Fracture extends through the posterior and anterior walls of the external auditory canal, which is partially opacified. Partial opacification of the mastoid air cells. Tympanic membrane is not well evaluated. Partial opacification of the middle ear cleft. Ossicles appear intact. Cochlea  and semicircular canals are unremarkable. Tegmen appears intact. Facial nerve demonstrates normal course. IMPRESSION: Fracture involves squamosal and mastoid portions of the left temporal bone. Otic capsule is spared. There  is involvement of anterior and posterior walls of the external auditory canal. Partial opacification of mastoid air cells, middle ear, and external auditory canal, which may reflect a combination of fluid and hemorrhage. Electronically Signed   By: Guadlupe SpanishPraneil  Patel M.D.   On: 09/11/2020 08:15    Assessment/Plan: Hospital day 2 closed injury much more calm less agitated appropriate moves all extremities well neurologically intact denies any neck pain I have clinically cleared his neck and removed his collar  LOS: 2 days    I will be out of town the rest the week there is no new neurosurgical recommendations please re-consult as needed.    Mariam DollarGary P Liyanna Cartwright 09/12/2020, 7:44 AM

## 2020-09-12 NOTE — Progress Notes (Signed)
Patient experiencing sustained bradycardia in the low 40s/30s. MD, Dr. Dwain Sarna notified. MD stated to keep monitoring because he is not all that concerned with a 25 yrs old with bradycardia. Will continue monitoring.

## 2020-09-12 NOTE — Progress Notes (Signed)
Occupational Therapy Treatment Patient Details Name: Elijah [name] MRN: [id number] DOB: February 24, 1996 Today's Date: 09/12/2020    History of present illness 25 yo male presenting via EMS after being found down at bottom of stairs. Head CT showing [facility] bilaterally (R>L).  L inf parietal bone fx; Temporal bone fx, crossing middle ear, possible sphenoid sinus fx. C-spine cleared via imaging, but not tested clinically yet secondary to pt cognitive status.Tested COVID-19 + on 2/20. No significant PMH noted.   OT comments  Pt A/O x4 today and following 1 step and multi step commands with increased time. Pt aware of IV and lines to avoid pulling on them. Pt requires cues to avoid some obstacles on ground. Pt verbalizing how to make pasta on stove. Pt education on supervisionA required for all tasks. Using LUE for AROM for grooming to 90* FF  Pt requiring supervisionA for ADL and mobility at this time. Pt appeared focused on tasks and was not distracted. Pt requires a non distracting environment to thrive. Pt appears to be closer to baseline functioning and would require supervisionA for all OOB tasks. VSS. O2 >90% on RA. Pt has increased functional mobility, cognition and ADL tasks to change disposition to HHOT. OT following acutely.     Follow Up Recommendations  Home health OT;Supervision/Assistance - 24 hour    Equipment Recommendations  None recommended by OT    Recommendations for Other Services      Precautions / Restrictions Precautions Precautions: Fall Precaution Comments: C-collar discontinued Required Braces or Orthoses: Other Brace Other Brace: C-spine cleared, no collar needs Restrictions Weight Bearing Restrictions: No       Mobility Bed Mobility Overal bed mobility: Needs Assistance Bed Mobility: Supine to Sit;Sit to Supine     Supine to sit: Supervision Sit to supine: Supervision   General bed mobility comments: S for safety    Transfers Overall transfer level:  Needs assistance Equipment used: 1 person hand held assist Transfers: Sit to/from Stand Sit to Stand: Min guard         General transfer comment: minguardA for safety    Balance Overall balance assessment: Needs assistance Sitting-balance support: No upper extremity supported;Feet supported Sitting balance-Leahy Scale: Fair     Standing balance support: During functional activity;No upper extremity supported Standing balance-Leahy Scale: Fair Standing balance comment: ambulatory without AD                           ADL either performed or assessed with clinical judgement   ADL Overall ADL's : Needs assistance/impaired Eating/Feeding: Modified independent   Grooming: Set up;Standing;Supervision/safety Grooming Details (indicate cue type and reason): standing at sink x10 mins for OOB ADL tasks. Pt                 Toilet Transfer: Supervision/safety;Ambulation Toilet Transfer Details (indicate cue type and reason): no physical assist required; pt able to avoid obstacles Toileting- Clothing Manipulation and Hygiene: Supervision/safety;Sitting/lateral lean;Sit to/from stand;Cueing for safety       Functional mobility during ADLs: Supervision/safety;Cueing for safety General ADL Comments: Pt requiring supervisionA for ADL and mobility at this time. Pt appeared focused on tasks and was not distracted. Pt requires a non distracting environment to thrive. Pt appears to be closer to baseline functioning and would require supervisionA for all OOB tasks.     Vision   Vision Assessment?: No apparent visual deficits   Perception     Praxis  Cognition Arousal/Alertness: Awake/alert Behavior During Therapy: Flat affect Overall Cognitive Status: Impaired/Different from baseline Area of Impairment: Attention;Memory;Following commands               Rancho Levels of Cognitive Functioning Rancho Los Amigos Scales of Cognitive Functioning: Other (comment)  (emerging VII)   Current Attention Level: Focused Memory: Decreased short-term memory Following Commands: Follows one step commands with increased time;Follows multi-step commands with increased time Safety/Judgement: Decreased awareness of deficits Awareness: Emergent Problem Solving: Slow processing;Decreased initiation;Difficulty sequencing;Requires verbal cues;Requires tactile cues General Comments: Pt A/O x4 today and following 1 step and multi step commands with increased time; Pt not bumping into walls this session. Pt aware of IV and lines to avoid pulling on them. Pt requires cues to avoid some obstacles on ground. Pt verbalizing how to make pasta on stove. Pt education on supervisionA required for all tasks. Pt appears clearer today since evaluation.        Exercises Other Exercises Other Exercises: STS x5, for LE strengthening and to ensure direction-following Other Exercises: PT education: s/s concussion (dizziness, nausea, irritability, photosensitivity) and management of s/s (decreasing light, screen time, rest). Pt and mother educated   Shoulder Instructions       General Comments VSS. O2 >90% on RA.    Pertinent Vitals/ Pain       Pain Assessment: 0-10 Pain Score: 3  Faces Pain Scale: Hurts even more Pain Location: headache Pain Descriptors / Indicators: Headache Pain Intervention(s): Monitored during session  Home Living                                          Prior Functioning/Environment              Frequency  Min 2X/week        Progress Toward Goals  OT Goals(current goals can now be found in the care plan section)  Progress towards OT goals: Progressing toward goals  Acute Rehab OT Goals Patient Stated Goal: go home OT Goal Formulation: With patient Time For Goal Achievement: 09/25/20 Potential to Achieve Goals: Good ADL Goals Pt Will Perform Grooming: with min assist;sitting Pt Will Transfer to Toilet: with min  assist;bedside commode;ambulating Additional ADL Goal #1: Pt will sustain attention to complete simple ADL with Min cues Additional ADL Goal #2: Pt will recall 3/3 grooming tasks to perform with Min cues  Plan Discharge plan needs to be updated    Co-evaluation                 AM-PAC OT "6 Clicks" Daily Activity     Outcome Measure   Help from another person eating meals?: None Help from another person taking care of personal grooming?: A Little Help from another person toileting, which includes using toliet, bedpan, or urinal?: A Little Help from another person bathing (including washing, rinsing, drying)?: A Little Help from another person to put on and taking off regular upper body clothing?: A Little Help from another person to put on and taking off regular lower body clothing?: A Little 6 Click Score: 19    End of Session Equipment Utilized During Treatment: Gait belt  OT Visit Diagnosis: Unsteadiness on feet (R26.81);Other symptoms and signs involving cognitive function;Pain   Activity Tolerance Patient tolerated treatment well   Patient Left in bed;with call bell/phone within reach;with bed alarm set   Nurse Communication Mobility status  Time: 1610-9604 OT Time Calculation (min): 21 min  Charges: OT General Charges $OT Visit: 1 Visit OT Treatments $Self Care/Home Management : 8-22 mins  Flora Lipps, OTR/L Acute Rehabilitation Services Pager: [phone] Office: [phone]    Flora Lipps 09/12/2020, 4:39 PM

## 2020-09-12 NOTE — Progress Notes (Signed)
Pt transferred to 4NP09 from 4NICU. Pt A&Ox4, vitals taken, full assessment completed, and appropriate PPE set up outside door. Pt belongings brought on cart by previous RN, Misty Stanley. Pt is content and says he wants to sleep at this time.  Robina Ade, RN

## 2020-09-13 LAB — BASIC METABOLIC PANEL
Anion gap: 11 (ref 5–15)
BUN: <5 mg/dL — ABNORMAL LOW (ref 6–20)
CO2: 22 mmol/L (ref 22–32)
Calcium: 9.1 mg/dL (ref 8.9–10.3)
Chloride: 105 mmol/L (ref 98–111)
Creatinine, Ser: 0.78 mg/dL (ref 0.61–1.24)
GFR, Estimated: >60 mL/min (ref >60)
Glucose, Bld: 96 mg/dL (ref 70–99)
Potassium: 3.3 mmol/L — ABNORMAL LOW (ref 3.5–5.1)
Sodium: 138 mmol/L (ref 135–145)

## 2020-09-13 LAB — CBC
HCT: 37.4 % — ABNORMAL LOW (ref 39.0–52.0)
Hemoglobin: 13 g/dL (ref 13.0–17.0)
MCH: 31 pg (ref 26.0–34.0)
MCHC: 34.8 g/dL (ref 30.0–36.0)
MCV: 89.3 fL (ref 80.0–100.0)
Platelets: 235 10*3/uL (ref 150–400)
RBC: 4.19 MIL/uL — ABNORMAL LOW (ref 4.22–5.81)
RDW: 11.9 % (ref 11.5–15.5)
WBC: 11.2 10*3/uL — ABNORMAL HIGH (ref 4.0–10.5)
nRBC: 0 % (ref 0.0–0.2)

## 2020-09-13 MED ORDER — ADULT MULTIVITAMIN W/MINERALS CH: 1.0000 | ORAL_TABLET | Freq: Every day | ORAL | Status: DC

## 2020-09-13 MED ORDER — CIPROFLOXACIN-DEXAMETHASONE 0.3-0.1 % OT SUSP: 4.0000 [drp] | Freq: Two times a day (BID) | OTIC | 0 refills | Status: DC

## 2020-09-13 MED ORDER — FOLIC ACID 1 MG PO TABS: 1.0000 mg | ORAL_TABLET | Freq: Every day | ORAL | Status: DC

## 2020-09-13 MED ORDER — LEVETIRACETAM 500 MG PO TABS: 500.0000 mg | ORAL_TABLET | Freq: Two times a day (BID) | ORAL | 0 refills | Status: DC

## 2020-09-13 MED ORDER — THIAMINE HCL 100 MG PO TABS: 100.0000 mg | ORAL_TABLET | Freq: Every day | ORAL | Status: DC

## 2020-09-13 MED ORDER — OXYCODONE HCL 5 MG PO TABS: 5.0000 mg | ORAL_TABLET | Freq: Four times a day (QID) | ORAL | 0 refills | Status: DC | PRN

## 2020-09-13 MED ORDER — ACETAMINOPHEN 500 MG PO TABS: 1000.0000 mg | ORAL_TABLET | Freq: Three times a day (TID) | ORAL | 0 refills | Status: DC | PRN

## 2020-09-13 MED ORDER — CIPROFLOXACIN-DEXAMETHASONE 0.3-0.1 % OT SUSP
4.0000 [drp] | Freq: Two times a day (BID) | OTIC | Status: DC
  Filled 2020-09-13 (×2): qty 7.5

## 2020-09-13 MED ORDER — METHOCARBAMOL 500 MG PO TABS
1000.0000 mg | ORAL_TABLET | Freq: Four times a day (QID) | ORAL | Status: DC
  Administered 2020-09-13: 1000 mg via ORAL
  Filled 2020-09-13: qty 2

## 2020-09-13 MED ORDER — POLYETHYLENE GLYCOL 3350 17 G PO PACK
17.0000 g | PACK | Freq: Every day | ORAL | Status: DC
  Administered 2020-09-13: 17 g via ORAL
  Filled 2020-09-13: qty 1

## 2020-09-13 MED ORDER — POLYETHYLENE GLYCOL 3350 17 G PO PACK: 17.0000 g | PACK | Freq: Every day | ORAL | 0 refills | Status: DC | PRN

## 2020-09-13 MED ORDER — ACETAMINOPHEN 500 MG PO TABS
1000.0000 mg | ORAL_TABLET | Freq: Four times a day (QID) | ORAL | Status: DC
  Administered 2020-09-13: 1000 mg via ORAL
  Filled 2020-09-13: qty 2

## 2020-09-13 MED ORDER — METHOCARBAMOL 500 MG PO TABS: 1000.0000 mg | ORAL_TABLET | Freq: Three times a day (TID) | ORAL | 0 refills | Status: DC | PRN

## 2020-09-13 NOTE — Progress Notes (Incomplete)
  Speech Language Pathology Patient Details Name: Elijah [name] MRN: [id number] DOB: 01-07-1996 Today's Date: 09/13/2020 Time:  -    .Cognitive assessment not - plans are for discharge home with 24 hour assist with mom. OT documentation reports pt was a Rancho V during their last session and cognitive impairments evident. Based on reports pt will need intervention for cognitive impairment s/p TBI. Please make referral to either home health or outpatient ST       Royce Macadamia 09/13/2020, 3:42 PM  Breck Coons Lonell Face.Ed Nurse, children's [phone] Office [phone]

## 2020-09-13 NOTE — Progress Notes (Signed)
Physical Therapy Treatment Patient Details Name: Elijah [name] MRN: [id number] DOB: 30-Mar-1996 Today's Date: 09/13/2020    History of Present Illness 25 yo male presenting via EMS after being found down at bottom of stairs. Head CT showing [facility] bilaterally (R>L).  L inf parietal bone fx; Temporal bone fx, crossing middle ear, possible sphenoid sinus fx. C-spine cleared via imaging, but not tested clinically yet secondary to pt cognitive status.Tested COVID-19 + on 2/20. No significant PMH noted.    PT Comments    Pt with improving dynamic standing balance today, with no overt LOB but mild unsteadiness when challenged with head turns, gait speed changes, and directional changes. PT encouraged pt to have someone with him when he is mobilizing at home to prevent falls, mother also aware as PT spoke with her on the phone yesterday about this. PT administered concussion handout and reviewed in detail, pt with no further questions and eager to d/c home.    Follow Up Recommendations  Home health PT;Supervision/Assistance - 24 hour     Equipment Recommendations  None recommended by PT    Recommendations for Other Services       Precautions / Restrictions Precautions Precautions: Fall Other Brace: C-spine cleared, no collar needs    Mobility  Bed Mobility Overal bed mobility: Needs Assistance Bed Mobility: Supine to Sit;Sit to Supine     Supine to sit: Supervision Sit to supine: Supervision   General bed mobility comments: supervision for safety    Transfers Overall transfer level: Needs assistance Equipment used: 1 person hand held assist Transfers: Sit to/from Stand Sit to Stand: Min guard         General transfer comment: min guard for safety, no posterior LOB like yesterday  Ambulation/Gait Ambulation/Gait assistance: Min guard Gait Distance (Feet): 100 Feet Assistive device: None Gait Pattern/deviations: Step-through pattern;Decreased stride length;Wide base of  support Gait velocity: decr   General Gait Details: min guard for safety, x1 period of min assist with R/L head turns as pt with weaving of gait. No overt LOB during normal walking today   Stairs Stairs: Yes Stairs assistance: Min guard Stair Management: One rail Right;Step to pattern Number of Stairs: 10 General stair comments: March in place as opposed to actual stair navigation due to covid precautions; min guard for safety, encouraging SL support and PA informed that pt's mother needs to be with him during step navigation and once in the apt, needs to stay in the apt.   Wheelchair Mobility    Modified Rankin (Stroke Patients Only)       Balance Overall balance assessment: Needs assistance Sitting-balance support: No upper extremity supported;Feet supported Sitting balance-Leahy Scale: Fair     Standing balance support: During functional activity;No upper extremity supported Standing balance-Leahy Scale: Fair Standing balance comment: ambulatory without AD             High level balance activites: Direction changes;Head turns;Turns High Level Balance Comments: WFL: stooping to pick up object off floor, standing marches with SL support. Mild impairment (deviation of gait path, unsteadiness): horizontal head turns, fast/slow walking            Cognition Arousal/Alertness: Awake/alert Behavior During Therapy: Flat affect Overall Cognitive Status: Impaired/Different from baseline Area of Impairment: Attention;Memory;Following commands;Safety/judgement;Problem solving                   Current Attention Level: Selective Memory: Decreased short-term memory Following Commands: Follows one step commands with increased time;Follows multi-step commands with increased  time Safety/Judgement: Decreased awareness of deficits   Problem Solving: Slow processing;Requires verbal cues;Requires tactile cues General Comments: Pt recognizes PT, but unable to recall PT name  or profession. Pt requiring cues for safety during mobility, lacks insight into mobility deficits      Exercises Other Exercises Other Exercises: Concussion protocol handout administered and reviewed with pt    General Comments        Pertinent Vitals/Pain Pain Assessment: Faces Faces Pain Scale: Hurts a little bit Pain Location: headache Pain Descriptors / Indicators: Headache Pain Intervention(s): Limited activity within patient's tolerance;Monitored during session    Home Living                      Prior Function            PT Goals (current goals can now be found in the care plan section) Acute Rehab PT Goals Patient Stated Goal: go home PT Goal Formulation: With patient Time For Goal Achievement: 09/25/20 Potential to Achieve Goals: Good Progress towards PT goals: Progressing toward goals    Frequency    Min 4X/week      PT Plan Current plan remains appropriate    Co-evaluation              AM-PAC PT "6 Clicks" Mobility   Outcome Measure  Help needed turning from your back to your side while in a flat bed without using bedrails?: A Little Help needed moving from lying on your back to sitting on the side of a flat bed without using bedrails?: A Little Help needed moving to and from a bed to a chair (including a wheelchair)?: A Little Help needed standing up from a chair using your arms (e.g., wheelchair or bedside chair)?: A Little Help needed to walk in hospital room?: A Little Help needed climbing 3-5 steps with a railing? : A Little 6 Click Score: 18    End of Session   Activity Tolerance: Patient limited by fatigue Patient left: in bed;with call bell/phone within reach;with bed alarm set Nurse Communication: Mobility status PT Visit Diagnosis: Difficulty in walking, not elsewhere classified (R26.2);Other symptoms and signs involving the nervous system (R29.898);Unsteadiness on feet (R26.81)     Time: 1010-1028 PT Time  Calculation (min) (ACUTE ONLY): 18 min  Charges:  $Gait Training: 8-22 mins                     Marye Round, PT Acute Rehabilitation Services Pager [phone]  Office [phone]   Truddie Coco 09/13/2020, 11:48 AM

## 2020-09-13 NOTE — Discharge Summary (Signed)
Patient ID: Elijah [name] [id number] [birth year] 25 y.o.  Admit date: 09/10/2020 Discharge date: 09/13/2020  Admitting Diagnosis: 25 yo male fall down stairs with disability  Discharge Diagnosis Fall down stairs Bilateral SAH Intraparenchymal ctx SDH L inf parietal bone fx Temporal bone fx, crossing middle ear Possible sphenoid sinus fx Basilar skull fx Etoh use L AC separation COVID +  Consultants Trauma ENT Neurosurgery Orthopedics   Procedures None   Hospital Course:  Elijah [name] is a 25 y.o. male who presented as a level 1 trauma activation.  Patient reported to be at home when other individuals heard a loud crash and found patient in the bottom of a 10-12 steps stairway with bleeding from his left ear.  EtOH level 343 on admission.  Patient found to have small bilateral subarachnoid hemorrhages, subdural hematoma, multiple skull fractures including left temporal bone transversing the middle ear.  Patient was admitted to the ICU under trauma service.  Incidentally noted to have Covid.  Neurosurgery and ENT was consulted.  CTA was performed of the neck without any vascular injury.  Repeat CT head with small volume bilateral subarachnoid hemorrhage, minor falcine and tentorial subdural hemorrhage, and increased visibility of evolving small bilateral hemorrhagic parenchymal contusions.  Neurosurgery recommended nonop treatment and Keppra for seizure prophylaxis.  Neurosurgery cleared C-spine.  ENT recommended no acute intervention for skull fx's, Ciprodex to left ear twice daily x1 week and outpatient follow-up for audiogram.  Patient worked with therapies and was ultimately recommended for home health PT/OT. While working with PT he was noted to have left shoulder pain for which xray was ordered and showed possible AC separation. Ortho was consulted and recommended non-op tx w/ sling for comfort and outpatient follow up. Prior to discharge, confirmed with PT that patient is  able to mobilize stairs with supervision of patients mother. He plans to stay with his mother who will provide 24/7 supervision at time of d/c. On 2/23, the patient was voiding well, tolerating diet, working well with therapies, pain well controlled, vital signs stable and felt stable for discharge home. Follow up as noted below. With patients permission I called and updated the patients mother prior to discharge.    Allergies as of 09/13/2020   No Known Allergies     Medication List    TAKE these medications   acetaminophen 500 MG tablet Commonly known as: TYLENOL Take 2 tablets (1,000 mg total) by mouth every 8 (eight) hours as needed.   ciprofloxacin-dexamethasone OTIC suspension Commonly known as: CIPRODEX Place 4 drops into the left ear 2 (two) times daily.   folic acid 1 MG tablet Commonly known as: FOLVITE Take 1 tablet (1 mg total) by mouth daily. Start taking on: September 14, 2020   levETIRAcetam 500 MG tablet Commonly known as: Keppra Take 1 tablet (500 mg total) by mouth 2 (two) times daily.   methocarbamol 500 MG tablet Commonly known as: ROBAXIN Take 2 tablets (1,000 mg total) by mouth every 8 (eight) hours as needed for muscle spasms.   multivitamin with minerals Tabs tablet Take 1 tablet by mouth daily. Start taking on: September 14, 2020   oxyCODONE 5 MG immediate release tablet Commonly known as: Oxy IR/ROXICODONE Take 1 tablet (5 mg total) by mouth every 6 (six) hours as needed for breakthrough pain (5mg  for moderate pain, 10mg  for severe pain).   polyethylene glycol 17 g packet Commonly known as: MIRALAX / GLYCOLAX Take 17 g by mouth daily as needed for  mild constipation.   thiamine 100 MG tablet Take 1 tablet (100 mg total) by mouth daily. Start taking on: September 14, 2020         Follow-up Information    Donalee Citrin, MD. Call in 1 day(s).   Specialty: Neurosurgery Why: For follow up of your traumatic brain injury Contact information: [address] [place] [address] [phone]        Rejeana Brock, MD. Call.   Specialty: Otolaryngology Why: For follow up of your facial fractures Contact information: [address] [place] [address] [phone]        [facility]. Call.   Why: As needed Contact information: [address] [place] [address] [phone]       Roby Lofts, MD. Call in 1 day(s).   Specialty: Orthopedic Surgery Why: For follow up of your left [facility] seperation Contact information: [address] [place] [address] [phone]               Signed: Leary Roca, [facility] 09/13/2020, 11:12 AM Please see Amion for pager number during day hours 7:00am-4:30pm

## 2020-09-13 NOTE — Discharge Instructions (Signed)
Traumatic Brain Injury  Traumatic brain injury (TBI) is an injury to the brain that results from:  · A hard, direct blow to the head (closed injury).  · An object penetrating the skull and entering the brain (open injury).  Traumatic brain injury is also called a head injury or a concussion. TBI can be mild, moderate, or severe.  What are the causes?  Common causes of this condition include:  · Falls.  · Motor vehicle accidents.  · Sports injuries.  · Assaults.  What increases the risk?  You are more likely to develop this condition if you:  · Are 75 years old or older.  · Are a man.  · Play contact sports, especially football, hockey, or soccer.  · Are in the military.  · Are a victim of violence.  · Abuse drugs or alcohol.  · Have had a previous TBI.  What are the signs or symptoms?  Symptoms may vary from person to person, and may include:  · Loss of consciousness.  · Headache.  · Confusion.  · Fatigue.  · Changes in sleep.  · Dizziness.  · Mood or personality changes.  · Memory problems.  · Nausea or vomiting or both.  · Seizures.  · Clumsiness.  · Slurred speech.  · Depression and anxiety.  · Anger.  · Trouble concentrating, organizing, or making decisions.  · Inability to control emotions or actions (impulse control).  · Loss of or dulling of the senses, such as hearing, vision, and touch. This can include:  ? Blurred vision.  ? Ringing in your ears.  How is this diagnosed?  This condition may be diagnosed based on:  · Medical history and physical exam.  · Neurologic exam. This checks for brain and nervous system function, including your reflexes, memory, and coordination.  · CT scan.  Your TBI may be described as mild, moderate, or severe.  How is this treated?  Treatment depends on the severity of your brain injury and may include:  · Breathing support (mechanical ventilation).  · Blood pressure medicines.  · Pain medicines.  · Treatments to decrease the swelling in your brain.  · Brain surgery. This may  be needed to:  ? Remove a blood clot.  ? Repair bleeding.  ? Remove an object that has penetrated the brain, such as a skull fragment or a bullet.  Treatment of TBI also includes:  · Physical and mental rest.  · Careful observation.  · Medicine. You may be prescribed medicines to help with symptoms such as headaches, nausea, or difficulty sleeping.  · Physical, occupational, and speech therapy.  · Referral to a concussion clinic or rehabilitation center.  Follow these instructions at home:  Medicines  · Take over-the-counter and prescription medicines only as told by your health care provider.  · Do not take blood thinners (anticoagulants), aspirin, or other anti-inflammatory medicines such as ibuprofen or naproxen unless approved by your health care provider.  Activity  · Rest. Rest helps the brain to heal. Make sure you:  ? Get plenty of sleep. Most adults should get 7-9 hours of sleep each night.  ? Rest during the day. Take daytime naps or rest breaks when you feel tired.  · Do not do high-risk activities that could cause a second concussion, such as riding a bike or playing sports. Having another concussion before the first one has healed can be dangerous.  · Avoid a lot of visual stimulation. This includes work on the   same. Complications sometimes occur after a brain injury. Older adults with a brain injury may have a higher risk of serious complications.  Seek support from friends and family.  Keep all follow-up visits as directed by your health care provider. This is important. Contact a health care  provider if:  Your symptoms get worse or they do not improve.  You have new symptoms.  You have another injury. Get help right away if:  You have: ? Severe, persistent headaches that are not relieved by medicine. ? Weakness or numbness in any part of your body. ? Confusion. ? Slurred speech. ? Difficulty waking up. ? Nausea or persistent vomiting. ? A feeling like you are moving when you are not (vertigo). ? Seizures or you faint. ? Changes in your vision. ? Clear or bloody discharge from your nose or ears.  You cannot use your arms or legs normally. Summary  Traumatic brain injury happens when there is a hard, direct blow to the head or when an object penetrates the skull and enters the brain.  Traumatic brain injuries may be mild, moderate, or severe. Treatment depends on the severity of your injury.  Get help right away if you have a head injury and you develop seizures, confusion, vomiting, weakness in the arms or legs, slurred speech, and other symptoms.  Rest is one of the best treatments. Do not return to activity until your health care provider approves. This information is not intended to replace advice given to you by your health care provider. Make sure you discuss any questions you have with your health care provider. Document Revised: 11/12/2017 Document Reviewed: 08/25/2017 Elsevier Patient Education  2021 [employer]   Skull Fracture, Adult  A skull fracture is a break or crack in one of the bones that make up the skull. Skull fractures range in severity. A skull fracture is more severe if the bones move out of place after the fracture, or if the brain, spine, nerves, or nearby blood vessels are also injured. A skull fracture is an emergency and needs immediate medical attention. What are the causes? This condition is usually caused by a forceful injury to the head, such as from:  A fall.  An assault.  A hard, direct hit to the head.  A car crash or a  recreational vehicle crash. What are the signs or symptoms? Symptoms of a skull fracture depend on what type of injury caused the fracture and how severe the injury is. Symptoms may include:  A headache.  Pain, swelling, or an indent in one area of the head or scalp.  Clear or bloody liquid leaking from the nose or ears.  Blurred or double vision.  Slurred speech.  Nausea or vomiting.  Bruising around the eyes or behind the ears.  Weakness or numbness in the face or in one side or area of the body.  A sudden loss of hearing or smell.  Confusion.  Trouble with balance or coordination.  Seizures. How is this diagnosed? This condition is diagnosed based on:  A physical exam and your medical history.  Tests, such as: ? CT scan. ? X-rays. ? MRI. ? A hearing test and an eye exam. ? A nerve test. This may be done to check for any damage to your facial nerves. If clear or bloody liquid is leaking from your nose or ears, it may be tested to check if it is cerebrospinal fluid, which is the type of fluid that surrounds the brain and  spinal cord. How is this treated? Most skull fractures heal without treatment. If treatment is needed, it may include:  Observation and rest. You may be admitted to a hospital for close observation.  Medicines. These may be given to relieve symptoms such as headaches, seizures, and nausea.  Antibiotic medicines, if you have a scalp wound. If you have a severe fracture, you may need surgery to correct the position of any bones that have moved. Surgery may also be needed to treat injuries to other areas of the head, spine, and face. Follow these instructions at home: Medicines  Take over-the-counter and prescription medicines only as told by your health care provider.  If you were prescribed an antibiotic medicine, take it as told by your health care provider. Do not stop taking the antibiotic even if you start to feel better. Wound care  Follow  instructions from your health care provider about how to take care of your wound. Make sure you: ? Wash your hands with soap and water before and after you change your bandage (dressing). If soap and water are not available, use hand sanitizer. ? Change your dressing as told by your health care provider. ? If you have stitches (sutures), staples, skin glue, or adhesive strips, leave them in place. These skin closures may need to stay in place for 2 weeks or longer. If adhesive strip edges start to loosen and curl up, you may trim the loose edges. Do not remove adhesive strips completely unless your health care provider tells you to do that. ? Check your wound every day for signs of infection. Check for:  More redness, swelling, or pain.  More fluid or blood.  Warmth.  Pus or a bad smell.   Activity  Rest as told by your health care provider. Ask your health care provider when you can return to your normal activities.  Do not lift anything that is heavier than 10 lb (4.5 kg), or the limit that you are told, until your health care provider says that it is safe.  Do not drive or use heavy machinery until your health care provider says that it is safe. General instructions  If you were treated in the hospital, have someone stay with you when you go home. This person will need to watch you for a few days and make sure that you get medical care if you have problems. Ask your health care provider how long someone should stay with you.  Do not drink alcohol until your health care provider says that you can.  Do not blow your nose until your health care provider says that it is okay.  Raise (elevate) your head above the level of your heart while you are lying down.  Keep all follow-up visits as told by your health care provider. This is important. Contact a health care provider if:  You feel nauseous.  You have ongoing (persistent) headaches that are not relieved by medicines.  Your  symptoms do not go away.  Your wound appears infected or starts bleeding. Get help right away if:  You vomit more than once.  You feel confused.  You have trouble talking or walking.  You have seizures.  You are drowsy or have trouble waking up.  You lose consciousness.  Your eyes move back and forth rapidly.  You have a severe headache.  Your arms or legs do not move the way they should.  Your pupils change size much more than they normally do.  You  have clear or bloody liquid coming from your nose or ears. These symptoms may represent a serious problem that is an emergency. Do not wait to see if the symptoms will go away. Get medical help right away. Call your local emergency services (911 in the [place] Do not drive yourself to the hospital. Summary  A skull fracture is a break or crack in one of the bones that make up the skull. This condition is an emergency and needs immediate medical attention.  A skull fracture is usually caused by a forceful injury to the head.  Most skull fractures heal without treatment. If treatment is needed, it may include rest, medicines, and surgery.  Keep all follow-up visits as told by your health care provider. This is important. This information is not intended to replace advice given to you by your health care provider. Make sure you discuss any questions you have with your health care provider. Document Revised: 09/24/2018 Document Reviewed: 09/24/2018 Elsevier Patient Education  2021 [employer] Inc.  You are being discharged from the hospital and were found to have a covid-19   Symptomatic. - Stay home for at least 5 days from your positive test and isolate from others in your home. Wear a well-fitted mask if you must be around others in your home. - End isolation after 5 full days if you are fever-free for 24 hours (without the use of fever-reducing medication) and your symptoms are improving. - Take precautions until day 10: Wear a  well-fitted mask for 10 full days any time you are around others inside your home or in public. Do not go to places where you are unable to wear a mask. Avoid travel. Avoid being around people who are at high risk  Asymptomatic. - End isolation after at least 5 full days after your positive test if you did NOT have symptoms  Exposed, unvaccinated. - Quarantine at home for at least 5 days. Wear a well-fitted mask if you must be around others in your home. - Get tested at least 5 days after exposure (even if asymptomatic). - Watch for symptoms for 10 days. Isolate and test if symptoms develop.  - Wear a well-fitted mask for 10 full days any time you are around others inside your home or in public. Do not go to places where you are unable to wear a mask. Avoid travel. Avoid being around people who are at high risk  Exposed, vaccinated or infected w/in past 90 days. - No quarantine unless you develop symptoms. - Get tested at least 5 days after exposure (even if asymptomatic). - Watch for symptoms for 10 days. Isolate and test if symptoms develop.  - Wear a well-fitted mask for 10 full days any time you are around others inside your home or in public. Do not go to places where you are unable to wear a mask. Avoid travel. Avoid being around people who are at high risk  Directions for you at home:  Wear a facemask You should wear a facemask that covers your nose and mouth when you are in the same room with other people and when you visit a healthcare provider. People who live with or visit you should also wear a facemask while they are in the same room with you.  Separate yourself from other people in your home As much as possible, you should stay in a different room from other people in your home. Also, you should use a separate bathroom, if available.  Avoid sharing  household items You should not share dishes, drinking glasses, cups, eating utensils, towels, bedding, or other items with  other people in your home. After using these items, you should wash them thoroughly with soap and water.  Cover your coughs and sneezes Cover your mouth and nose with a tissue when you cough or sneeze, or you can cough or sneeze into your sleeve. Throw used tissues in a lined trash can, and immediately wash your hands with soap and water for at least 20 seconds or use an alcohol-based hand rub.  Wash your [employer] your hands often and thoroughly with soap and water for at least 20 seconds. You can use an alcohol-based hand sanitizer if soap and water are not available and if your hands are not visibly dirty. Avoid touching your eyes, nose, and mouth with unwashed hands.  Directions for those who live with, or provide care at home for you:  Limit the number of people who have contact with the patient  If possible, have only one caregiver for the patient.  Other household members should stay in another home or place of residence. If this is not possible, they should stay  in another room, or be separated from the patient as much as possible. Use a separate bathroom, if available.  Restrict visitors who do not have an essential need to be in the home.  Ensure good ventilation Make sure that shared spaces in the home have good air flow, such as from an air conditioner or an opened window, weather permitting.  Wash your hands often  Wash your hands often and thoroughly with soap and water for at least 20 seconds. You can use an alcohol based hand sanitizer if soap and water are not available and if your hands are not visibly dirty.  Avoid touching your eyes, nose, and mouth with unwashed hands.  Use disposable paper towels to dry your hands. If not available, use dedicated cloth towels and replace them when they become wet.  Wear a facemask and gloves  Wear a disposable facemask at all times in the room and gloves when you touch or have contact with the patients blood, body  fluids, and/or secretions or excretions, such as sweat, saliva, sputum, nasal mucus, vomit, urine, or feces.  Ensure the mask fits over your nose and mouth tightly, and do not touch it during use.  Throw out disposable facemasks and gloves after using them. Do not reuse.  Wash your hands immediately after removing your facemask and gloves.  If your personal clothing becomes contaminated, carefully remove clothing and launder. Wash your hands after handling contaminated clothing.  Place all used disposable facemasks, gloves, and other waste in a lined container before disposing them with other household waste.  Remove gloves and wash your hands immediately after handling these items.  Do not share dishes, glasses, or other household items with the patient  Avoid sharing household items. You should not share dishes, drinking glasses, cups, eating utensils, towels, bedding, or other items with a patient who is confirmed to have, or being evaluated for, COVID-19 infection.  After the person uses these items, you should wash them thoroughly with soap and water.  Wash laundry thoroughly  Immediately remove and wash clothes or bedding that have blood, body fluids, and/or secretions or excretions, such as sweat, saliva, sputum, nasal mucus, vomit, urine, or feces, on them.  Wear gloves when handling laundry from the patient.  Read and follow directions on labels of laundry or clothing  items and detergent. In general, wash and dry with the warmest temperatures recommended on the label.  Clean all areas the individual has used often  Clean all touchable surfaces, such as counters, tabletops, doorknobs, bathroom fixtures, toilets, phones, keyboards, tablets, and bedside tables, every day. Also, clean any surfaces that may have blood, body fluids, and/or secretions or excretions on them.  Wear gloves when cleaning surfaces the patient has come in contact with.  Use a diluted bleach solution  (e.g., dilute bleach with 1 part bleach and 10 parts water) or a household disinfectant with a label that says EPA-registered for coronaviruses. To make a bleach solution at home, add 1 tablespoon of bleach to 1 quart (4 cups) of water. For a larger supply, add  cup of bleach to 1 gallon (16 cups) of water.  Read labels of cleaning products and follow recommendations provided on product labels. Labels contain instructions for safe and effective use of the cleaning product including precautions you should take when applying the product, such as wearing gloves or eye protection and making sure you have good ventilation during use of the product.  Remove gloves and wash hands immediately after cleaning.  Monitor yourself for signs and symptoms of illness Caregivers and household members are considered close contacts, should monitor their health, and will be asked to limit movement outside of the home to the extent possible. Follow the monitoring steps for close contacts listed on the symptom monitoring form.  If you have additional questions, contact your local health department or call the epidemiologist on call at [phone] (available 24/7). This guidance is subject to change. For the most up-to-date guidance from CDC, please refer to their website: [employer]huhttps://www.cdc.gov/coronavirus/2019-ncov/hcp/guidance-prevent-spread.html   Acromioclavicular Separation  A shoulder separation (acromioclavicular separation) is an injury to the tissues that connect bones to each other (ligaments) between the top of your shoulder blade (acromion) and your collarbone (clavicle). The ligaments may be stretched, partially torn, or completely torn.  A stretched ligament may not cause much pain, and it does not move the collarbone out of place. A stretched ligament looks normal on an X-ray.  A partial tear causes an injury that is a bit worse, and it may move the collarbone slightly out of place.  A complete tear  causes serious injury. The surrounding shoulder ligaments are completely torn. This moves the collarbone out of position and creates a bad shape (deformity) of the shoulder. What are the causes? Common causes of this condition include:  Falling on the shoulder.  Receiving a hard, direct hit (blow) to the top of the shoulder.  Falling on an outstretched arm. What increases the risk? You may be at greater risk of a shoulder separation if you:  Are male.  Are younger than 535.  Play a contact sport, such as football or hockey. What are the signs or symptoms? The most common symptom of a shoulder separation is pain on the top of the shoulder after falling on it or receiving a blow to it. Other signs and symptoms include:  Shoulder deformity.  Swelling of the shoulder.  Decreased ability to move the shoulder.  Bruising on top of the shoulder. How is this diagnosed? Your health care provider may suspect a shoulder separation based on your symptoms and the details of a recent injury you experienced. The condition will be diagnosed based on:  A physical exam. Your provider may: ? Press on your shoulder. ? Test the movement of your shoulder. ? Ask you to hold  a weight in your hand to see if the separation increases.  Imaging tests, such as: ? X-rays. ? MRI. How is this treated? Treatment for this condition depends on the cause and severity of the injury.  A shoulder separation caused by a stretched ligament may require 2-12 weeks of the following: ? Wearing a sling. ? Taking medicines to help relieve pain. ? Applying cold packs to your shoulder. ? Physical therapy. If needed, a physical therapist will teach you to do daily exercises to strengthen your shoulder muscles and prevent stiffness.  Surgery may be needed for severe injuries that include breaks (fractures) in a bone, or injuries that do not get better with nonsurgical treatments. To help with healing, you will need to  keep your joint in place for a period of time (immobilization) and do physical therapy. Follow these instructions at home: Medicines  Take over-the-counter and prescription medicines only as told by your health care provider.  Do not drive or use heavy machinery while taking prescription pain medicine.  If you are taking prescription pain medicine, take actions to prevent or treat constipation. Your health care provider may recommend that you: ? Drink enough fluid to keep your urine pale yellow. ? Eat foods that are high in fiber, such as fresh fruits and vegetables, whole grains, and beans. ? Limit foods that are high in fat and processed sugars, such as fried or sweet foods. ? Take an over-the-counter or prescription medicine for constipation. If you have a sling:  Wear your sling as told by your health care provider. Remove it only as told by your health care provider.  Loosen the sling if your fingers tingle, become numb, or turn cold and blue.  Keep the sling clean.  If the sling is not waterproof: ? Do not let it get wet. ? Cover it with a watertight covering when you take a bath or a shower. Managing pain, stiffness, and swelling  If directed, apply ice to the top of your shoulder: ? Put ice in a plastic bag. ? Place a towel between your skin and the bag. ? Leave the ice on for 20 minutes, 2-3 times a day.  Do not do any activities that make your pain worse.   Activity  Do not lift anything that is heavier than 10 lb (4.5 kg), or the limit that you were told, until your health care provider says that it is safe.  Rest your shoulder. Avoid activities that take a lot of effort (strenuous activities) for as long as told by your health care provider.  Return to your normal activities as told by your health care provider. Ask your health care provider what activities are safe for you.  Do range of motion exercises as told by your health care provider. General  instructions  Do not use any products that contain nicotine or tobacco, such as cigarettes and e-cigarettes. These can delay healing. If you need help quitting, ask your health care provider.  Keep all follow-up visits as told by your health care provider. This is important. These include visits for physical therapy as directed by your health care provider. Contact a health care provider if:  Your pain medicine is not relieving your pain.  Your pain and stiffness are not improving after 2 weeks.  You are unable to do your physical therapy exercises because of pain or stiffness. Get help right away if:  Your arm on the injured side feels cold or numb.  Your skin or  fingers on the arm on the injured side turn blue or gray. Summary  A shoulder separation (acromioclavicular separation) is an injury to the tissues that connect bones to each other (ligaments) between the top of your shoulder blade (acromion) and your collarbone (clavicle).  The ligaments may be stretched, partially torn, or completely torn.  The most common cause of a shoulder separation is falling on or receiving a blow to the top of the shoulder. Falling with an outstretched arm may also cause this injury.  Rest your shoulder. Avoid activities that take a lot of effort (strenuous activities) for as long as told by your health care provider. This information is not intended to replace advice given to you by your health care provider. Make sure you discuss any questions you have with your health care provider. Document Revised: 08/23/2017 Document Reviewed: 08/23/2017 Elsevier Patient Education  2021 [employer].

## 2020-09-13 NOTE — Consult Note (Signed)
Orthopaedic Trauma Service (OTS) Consult   Patient ID: REEGAN BOUFFARD MRN: [id number] DOB/AGE: 1996-06-30 24 y.o.  Reason for Consult: Left shoulder pain Referring Physician: Dr. Kris Mouton, MD  HPI: NIKLAS CHRETIEN is an 25 y.o. male being seen in consultation at the request of Dr. Bedelia Person for left shoulder pain.  Patient was brought to [facility] emergency department for evaluation on 09/10/2020 after falling down a set of 10-12 steps.  Patient was found to have ICH.  Was admitted to trauma service.  As of 09/12/2020 patient had complaints of left shoulder pain x-rays revealed borderline widening of AC joint.  Orthopedics was consulted for evaluation.  Patient seen this AM on 4NP.  Not having any significant pain in left shoulder currently.  At rest it does not bother him but with motion he notes some soreness over the top of the shoulder.  Denies any numbness or tingling throughout the upper extremity.  Denies injury to other extremities. Patient works full-time cutting down trees. His main focus currently is when he can be discharged.   No past medical history on file.  No family history on file.  Social History:  has no history on file for tobacco use, alcohol use, and drug use.  Allergies: No Known Allergies  Medications:  I have reviewed the patient's current medications. Prior to Admission:  No medications prior to admission.    ROS: Constitutional: No fever or chills Vision: No changes in vision ENT: No difficulty swallowing CV: No chest pain Pulm: No SOB or wheezing GI: No nausea or vomiting GU: No urgency or inability to hold urine Skin: No poor wound healing Neurologic: No numbness or tingling Psychiatric: No depression or anxiety Heme: No bruising Allergic: No reaction to medications or food   Exam: Blood pressure 134/79, pulse (!) 54, temperature 98.5 F (36.9 C), temperature source Oral, resp. rate 15, height 6' (1.829 m), weight 79.5 kg, SpO2 99  %. General:Laying in bed, NAD Orientation: Alert and oriented  Mood and Affect: Mood and affect appropriate  Left upper extremity: Skin without lesions.  No significant swelling or bruising noted.  No significant tenderness with palpation about the shoulder or over the clavicle..  Tolerates forward elevation as well as abduction of the shoulder without significant discomfort or pain.  Has full painless elbow motion. Motor and sensory function intact distally. +radial pulse  Right upper extremity: Skin without lesions. No tenderness to palpation. Full painless ROM, full strength in each muscle group without evidence of instability.   Medical Decision Making: Data: Imaging: Two views of left shoulder shows borderline widening of AC joint when compared to contralateral side seen on scout films from CT scan.  No other adverse features noted.  Labs:  Results for orders placed or performed during the hospital encounter of 09/10/20 (from the past 24 hour(s))  CBC     Status: Abnormal   Collection Time: 09/13/20  4:25 AM  Result Value Ref Range   WBC 11.2 (H) 4.0 - 10.5 K/uL   RBC 4.19 (L) 4.22 - 5.81 MIL/uL   Hemoglobin 13.0 13.0 - 17.0 g/dL   HCT 67.8 (L) 93.8 - 10.1 %   MCV 89.3 80.0 - 100.0 fL   MCH 31.0 26.0 - 34.0 pg   MCHC 34.8 30.0 - 36.0 g/dL   RDW 75.1 02.5 - 85.2 %   Platelets 235 150 - 400 K/uL   nRBC 0.0 0.0 - 0.2 %  Basic metabolic panel     Status:  Abnormal   Collection Time: 09/13/20  4:25 AM  Result Value Ref Range   Sodium 138 135 - 145 mmol/L   Potassium 3.3 (L) 3.5 - 5.1 mmol/L   Chloride 105 98 - 111 mmol/L   CO2 22 22 - 32 mmol/L   Glucose, Bld 96 70 - 99 mg/dL   BUN <5 (L) 6 - 20 mg/dL   Creatinine, Ser 3.84 0.61 - 1.24 mg/dL   Calcium 9.1 8.9 - 66.5 mg/dL   GFR, Estimated >99 >35 mL/min   Anion gap 11 5 - 15    Assessment/Plan: 25 year old male status post fall down steps with complaints of left shoulder pain, x-ray with borderline widening of AC  joint  Recommend conservative treatment of left AC joint.  Sling for comfort.  Okay for unrestricted shoulder motion as patient can tolerate.  Would recommend rest, ice, and NSAIDs for pain control once able from neurosurgical standpoint. May increase weightbearing as pain continues to improve.  Patient may follow-up with Dr. Jena Gauss in 2 to 3 weeks if pain does not resolve.   Ayisha Pol A. Ladonna Snide [facility] [phone] (office) [email or web address]

## 2020-09-13 NOTE — TOC Transition Note (Addendum)
Transition of Care [facility]) - CM/SW Discharge Note   Patient Details  Name: Elijah [name] MRN: [id number] Date of Birth: October 17, 1995  [facility]) CM/SW Contact:  Glennon Mac, RN Phone Number: 09/13/2020, 2:36 PM   Clinical Narrative:  25 yo male presenting via EMS after being found down at bottom of stairs. Head CT showing [facility] bilaterally (R>L).  L inf parietal bone fx; Temporal bone fx, crossing middle ear, possible sphenoid sinus fx.  PTA, patient independent and living at home with roommate.  PT/OT recommending home health follow-up; secured home health services with [place] [facility].  Spoke with patient's mother, [name] is able to provide 24h assistance at dc.  She states patient will DC to her home at [address], [place], [place] [address].   No DME recommended.     Final next level of care: Home w Home Health Services Barriers to Discharge: Barriers Resolved   Patient Goals and CMS Choice Patient states their goals for this hospitalization and ongoing recovery are:: to go home CMS Medicare.gov Compare Post Acute Care list provided to:: Patient Represenative (must comment) (mother, Arnold Kester) Choice offered to / list presented to : Parent                      Discharge Plan and Services   Discharge Planning Services: CM Consult Post Acute Care Choice: Home Health                    HH Arranged: PT,OT [facility] Agency: [facility] Date [facility] Agency Contacted: 09/13/20 Time HH Agency Contacted: 1426 Representative spoke with at [facility] Agency: Lorenza Chick  Social Determinants of Health (SDOH) Interventions     Readmission Risk Interventions No flowsheet data found.  Quintella Baton, RN, BSN  Trauma/Neuro ICU Case Manager [phone]

## 2020-09-13 NOTE — Progress Notes (Addendum)
Subjective: CC: Patient reports he is doing better today. He denies HA, visual changes, n/v, cp, sob, or abdominal pain. He is having some left shoulder pain. Found to have a possible AC separation for which Ortho has seen already and recommended non-op. Tolerating diet. Voiding. Mobilized with PT yesterday. Plans to d/c with his mother today. 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   Objective: Vital signs in last 24 hours: Temp:  [98.5 F (36.9 C)-99.3 F (37.4 C)] 98.5 F (36.9 C) (02/23 0739) Pulse Rate:  [54-60] 54 (02/23 0739) Resp:  [12-20] 15 (02/23 0739) BP: (134-155)/(70-86) 134/79 (02/23 0739) SpO2:  [96 %-100 %] 99 % (02/23 0739) Last BM Date:  (PTA)  Intake/Output from previous day: 02/22 0701 - 02/23 0700 In: 814.9 [P.O.:240; I.V.:474.9; IV Piggyback:100] Out: 3750 [Urine:3750] Intake/Output this shift: Total I/O In: -  Out: 425 [Urine:425]  PE: Gen:  Alert, NAD, pleasant HEENT: EOM's intact, pupils equal and round Card:  RRR Pulm:  CTAB, no W/R/R, effort normal Abd: Soft, NT/ND, +BS Ext:  MAE's. No calf edema or tenderness Psych: A&Ox4 Skin: no rashes noted, warm and dry  Lab Results:  Recent Labs    09/12/20 0138 09/13/20 0425  WBC 15.5* 11.2*  HGB 13.0 13.0  HCT 39.4 37.4*  PLT 256 235   BMET Recent Labs    09/12/20 0138 09/13/20 0425  NA 137 138  K 3.8 3.3*  CL 106 105  CO2 21* 22  GLUCOSE 103* 96  BUN 6 <5*  CREATININE 0.88 0.78  CALCIUM 8.8* 9.1   PT/INR Recent Labs    09/10/20 1813  LABPROT 12.1  INR 0.9   CMP     Component Value Date/Time   NA 138 09/13/2020 0425   K 3.3 (L) 09/13/2020 0425   CL 105 09/13/2020 0425   CO2 22 09/13/2020 0425   GLUCOSE 96 09/13/2020 0425   BUN <5 (L) 09/13/2020 0425   CREATININE 0.78 09/13/2020 0425   CALCIUM 9.1 09/13/2020 0425   PROT 6.7 09/10/2020 1813   ALBUMIN 3.8 09/10/2020 1813   AST 23 09/10/2020 1813   ALT 18 09/10/2020 1813   ALKPHOS 52 09/10/2020 1813    BILITOT 0.6 09/10/2020 1813   GFRNONAA >60 09/13/2020 0425   Lipase  No results found for: LIPASE     Studies/Results: DG Shoulder Left Port  Result Date: 09/12/2020 CLINICAL DATA:  Shoulder pain EXAM: LEFT SHOULDER COMPARISON:  None. FINDINGS: No fracture or dislocation. Borderline widening of AC joint. Left lung apex is clear IMPRESSION: Borderline widening of the AC joint. Electronically Signed   By: Jasmine Pang M.D.   On: 09/12/2020 23:40    Anti-infectives: Anti-infectives (From admission, onward)   None       Assessment/Plan Fall down stairs Small bilateral SAH, evolving intraparenchymal ctx, small SDH; no mass effect- NSGY consult, Dr. Wynetta Emery, repeat head CT with evolving findings, no acute interventions per their team. keppra x7d for sz ppx, TBI therapies.  L inf parietal bone fx; Temporal bone fx, crossing middle ear, possible sphenoid sinus fx- Per ENT - Dr. Elijah Birk. No acute intervention needed. Cirpodex drops into left ear BID x 1 week. Follow up outpatient for audiogram.  Basilar skull fx: CTA shows no gross traumatic vascular injury; motion artifact seen. NSGY and ENT following.  Etoh use - CIWA. Beer.  L AC separation - per ortho, Dr. Jena Gauss. Non-op. Sling for comfort. Follow up in 2-3 weeks.  COVID + - asymptomatic from this currently. Lungs clear on CT. If develops symptoms, may need TRH consult.  C-Spine - cleared by NSGY FEN - Reg, d/c IVF. Bowel regimen  ID - None  Foley - None  VTE: SCDs; chemical dvt ppx once cleared for this by nsgy Dispo:PT/OT. Plan for dispo with patients mother after PT/OT session today.    LOS: 3 days    Jacinto Halim , [facility] Surgery 09/13/2020, 9:05 AM Please see Amion for pager number during day hours 7:00am-4:30pm

## 2020-09-27 ENCOUNTER — Other Ambulatory Visit: Payer: Self-pay | Admitting: Student

## 2020-09-27 DIAGNOSIS — S065XAA Traumatic subdural hemorrhage with loss of consciousness status unknown, initial encounter: Secondary | ICD-10-CM

## 2020-09-27 DIAGNOSIS — S065X9A Traumatic subdural hemorrhage with loss of consciousness of unspecified duration, initial encounter: Secondary | ICD-10-CM

## 2020-10-03 ENCOUNTER — Ambulatory Visit
Admission: RE | Admit: 2020-10-03 | Discharge: 2020-10-03 | Disposition: A | Discharge status: Home / Self Care | Payer: 59 | Source: Ambulatory Visit | Attending: Student | Admitting: Student

## 2020-10-03 ENCOUNTER — Other Ambulatory Visit: Payer: Self-pay

## 2020-10-03 DIAGNOSIS — S065XAA Traumatic subdural hemorrhage with loss of consciousness status unknown, initial encounter: Secondary | ICD-10-CM

## 2020-10-03 DIAGNOSIS — S065X9A Traumatic subdural hemorrhage with loss of consciousness of unspecified duration, initial encounter: Secondary | ICD-10-CM

## 2020-10-25 ENCOUNTER — Encounter: Payer: Self-pay | Admitting: Physical Medicine & Rehabilitation

## 2020-10-25 ENCOUNTER — Other Ambulatory Visit: Payer: Self-pay

## 2020-10-25 ENCOUNTER — Encounter
Payer: BC Managed Care – PPO | Attending: Physical Medicine & Rehabilitation | Admitting: Physical Medicine & Rehabilitation

## 2020-10-25 DIAGNOSIS — S06303S Unspecified focal traumatic brain injury with loss of consciousness of 1 hour to 5 hours 59 minutes, sequela: Secondary | ICD-10-CM | POA: Insufficient documentation

## 2020-10-25 DIAGNOSIS — H8112 Benign paroxysmal vertigo, left ear: Secondary | ICD-10-CM | POA: Diagnosis present

## 2020-10-25 DIAGNOSIS — H8110 Benign paroxysmal vertigo, unspecified ear: Secondary | ICD-10-CM | POA: Insufficient documentation

## 2020-10-25 MED ORDER — AMPHETAMINE-DEXTROAMPHETAMINE 5 MG PO TABS
5.0000 mg | ORAL_TABLET | Freq: Two times a day (BID) | ORAL | 0 refills | Status: DC
Start: 2020-10-25 — End: 2020-11-24

## 2020-10-25 NOTE — Progress Notes (Signed)
Subjective:    Patient ID: Elijah [name], male    DOB: 17-Apr-1996, 25 y.o.   MRN: [id number]  HPI This is an initial evaluation for [name] is a 25 year old male who presented on September 10, 2020 to [facility] after fall down stairs.  Alcohol was involved with a level of 343 at admission.  Upon arrival patient was found to have bilateral subarachnoid hemorrhages, subdural hematoma, multiple skull fractures including left temporal bone traversing the middle ear.  Patient was admitted to Neuro ICU.  Neurosurgery and ENT saw the patient.  He was found to be Covid positive.  No vascular injuries were reported.  Follow-up CT of the head revealed small volume bilateral subarachnoid hemorrhage minor falcine and tentorial subdural hemorrhage and small bilateral hemorrhagic parenchymal contusions and confirmed left inferior parietal bone fx, left temporal bone fx, basilar skull fx, ?sphenoid sinus fx.  Neurosurgery recommended conservative management.  He was placed on Keppra for seizure prophylaxis.  ENT did not recommend any intervention either.  Patient did develop some left shoulder pain while working with physical therapy.  X-rays revealed possible AC joint separation.  Orthopedic surgery recommended sling and conservative care.  Patient was discharged home on September 13, 2020.  After he got home he reports a lot of resting for the first few weeks. More recently he's started doing chores around the house.   He still feels that his balance is "off" and that his "thinking is off". He struggles to focus and sometimes to find words or feels foggy. STM can be an issue at times. He hasn't fallen but often feels that he's going to lose his balance. He denies frank dizziness except perhaps when he is going up stairs or that the room is spinning.   His sleep is improving after a rough start. He will sleep 8+ hours per night recognizing that he needs more sleep. He typically will go to bed around 11pm-12 am and  awake around 9 to 9:30. He does not feel rested when he wakes up. He denies nightmares or difficulties falling asleep. His mood has been fairly calm and no irritability or depression is reported.   Headaches are mild after the first couple weeks. He feels that they are manageable now. His left shoulder still can be problematic. It hurts when he lifts things off the ground with his left arm.   He lost hearing in his left ear. He is followed by Dr. Pollyann Kennedy. He's had an audiology evaluation  He was on adderall remotely for ADHD but stopped it some time ago.     Pain Inventory Average Pain 1 Pain Right Now 0 My pain is .  LOCATION OF PAIN  head  BOWEL Number of stools per week: 7 Oral laxative use No  Type of laxative na Enema or suppository use No  History of colostomy No  Incontinent No   BLADDER Normal In and out cath, frequency na Able to self cath na Bladder incontinence No  Frequent urination No  Leakage with coughing No  Difficulty starting stream No  Incomplete bladder emptying No    Mobility walk without assistance how many minutes can you walk? 45 ability to climb steps?  yes do you drive?  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 Psoriasis  Sister   . Heart disease Maternal Grandfather    Social History   Socioeconomic History  . Marital status: Single    Spouse name: Not on file  . Number of children: Not on file  . Years of education: Not on file  . Highest education level: Not on file  Occupational History  . Not on file  Tobacco Use  . Smoking status: Current Every Day Smoker  . Smokeless tobacco: Not on file  Substance and Sexual Activity  . Alcohol use: No  . Drug use: No  . Sexual activity: Yes  Other Topics Concern  . Not on file  Social History Narrative   . Not on file   Social Determinants of Health   Financial Resource Strain: Not on file  Food Insecurity: Not on file  Transportation Needs: Not on file  Physical Activity: Not on file  Stress: Not on file  Social Connections: Not on file   History reviewed. No pertinent surgical history. Past Medical History:  Diagnosis Date  . ADD (attention deficit disorder)    BP 104/70   Pulse 85   Temp 97.8 F (36.6 C)   Ht 5\' 10"  (1.778 m)   Wt 179 lb (81.2 kg)   SpO2 98%   BMI 25.68 kg/m   Opioid Risk Score:   Fall Risk Score:  `1  Depression screen PHQ 2/9  Depression screen [facility] 2/9 10/25/2020 05/16/2015  Decreased Interest 1 0  Down, Depressed, Hopeless 0 0  PHQ - 2 Score 1 0  Altered sleeping 2 -  Tired, decreased energy 0 -  Change in appetite 0 -  Feeling bad or failure about yourself  0 -  Trouble concentrating 0 -  Moving slowly or fidgety/restless 1 -  Suicidal thoughts 0 -  PHQ-9 Score 4 -  Difficult doing work/chores Somewhat difficult -   Review of Systems     Objective:   Physical Exam Gen: no distress, normal appearing HEENT: oral mucosa pink and moist, NCAT Cardio: Reg rate Chest: normal effort, normal rate of breathing Abd: soft, non-distended Ext: no edema Psych: pleasant, normal affect Skin: intact Neuro: Alert and oriented x 3. Normal insight and awareness. Intact Memory. Normal language and speech. Cranial nerve exam unremarkable except for 3-4 beats of nystagmus to the right and 2 beats to the left with lateral gaze.. Serial 7's intact. Spelled "world" forward but missed backwards. sequencd numbers. Abstract thinking generally appropriate. 3/3 words after 5 minutes.  Aware of current events.  Intact abstract thinking.  Good insight and awareness. Normal language.  Tandem gait is normal.  Struggle with heel-to-toe ambulation losing balance quickly to the left. Musculoskeletal: Pain at the left [facility] joint worse with forward flexion of the shoulder.   Minimal pain with external and internal rotation.       Assessment & Plan:  1. 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  Patient with ongoing cognitive deficits with impaired focus and concentration.  Patient also with impaired balance and vestibular symptoms. 2.  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  History of ADHD   Plan: 1.  Made referral to outpatient neuro rehab for speech therapy to address concentration and focus. 2.  Made referral to outpatient neuro rehab for physical therapy to address vestibular symptoms and balance 3.  Resume Adderall 5 mg daily at breakfast and lunch.  Titrate upwards as needed. 4.  Discussed smoking cessation and avoidance of THC 5.  Continue to optimize sleep as  possible 6.  Encouraged brain stimulation and exercises for thinking at home.  He plays Scrabble a lot with his mother which is very good for him. 7.  Encouraged aerobic exercise such as walking.  He could even do some light weightlifting with machines as well.  Would avoid heavy free weights.  45 minutes was spent with patient today in the office.  I will see him back in about 2 months time.

## 2020-10-25 NOTE — Patient Instructions (Signed)
PLEASE FEEL FREE TO CALL OUR OFFICE WITH ANY PROBLEMS OR QUESTIONS ([phone])      

## 2020-11-08 ENCOUNTER — Ambulatory Visit: Payer: BC Managed Care – PPO | Admitting: Speech Pathology

## 2020-11-08 ENCOUNTER — Other Ambulatory Visit: Payer: Self-pay

## 2020-11-08 ENCOUNTER — Encounter: Payer: Self-pay | Admitting: Speech Pathology

## 2020-11-08 ENCOUNTER — Ambulatory Visit: Payer: BC Managed Care – PPO | Attending: Physical Medicine & Rehabilitation

## 2020-11-08 DIAGNOSIS — Z9181 History of falling: Secondary | ICD-10-CM | POA: Insufficient documentation

## 2020-11-08 DIAGNOSIS — R2681 Unsteadiness on feet: Secondary | ICD-10-CM | POA: Diagnosis not present

## 2020-11-08 DIAGNOSIS — R42 Dizziness and giddiness: Secondary | ICD-10-CM | POA: Insufficient documentation

## 2020-11-08 DIAGNOSIS — M6281 Muscle weakness (generalized): Secondary | ICD-10-CM | POA: Diagnosis present

## 2020-11-08 DIAGNOSIS — R41841 Cognitive communication deficit: Secondary | ICD-10-CM | POA: Diagnosis present

## 2020-11-08 DIAGNOSIS — M25512 Pain in left shoulder: Secondary | ICD-10-CM | POA: Insufficient documentation

## 2020-11-08 NOTE — Therapy (Signed)
[facility] [address] [place], [place], [address] Phone: [phone]   Fax:  [phone]  Speech Language Pathology Evaluation  Patient Details  Name: Elijah [name] MRN: [id number] Date of Birth: November 04, 1995 Referring Provider (SLP): Ranelle Oyster, MD   Encounter Date: 11/08/2020   End of Session - 11/08/20 1437    Visit Number 1    Number of Visits 3    Date for SLP Re-Evaluation 12/08/20    SLP Start Time 1400    SLP Stop Time  1430    SLP Time Calculation (min) 30 min    Activity Tolerance Patient tolerated treatment well           Past Medical History:  Diagnosis Date  . ADD (attention deficit disorder)     History reviewed. No pertinent surgical history.  There were no vitals filed for this visit.   Subjective Assessment - 11/08/20 1428    Subjective Pt reports he occasionally has difficulty with memory and word finding.    Patient is accompained by: Family member    Currently in Pain? No/denies              SLP Evaluation [facility] - 11/08/20 1428      SLP Visit Information   SLP Received On 11/08/20    Referring Provider (SLP) Ranelle Oyster, MD    Onset Date 09/10/20    Medical Diagnosis TBI      Subjective   Patient/Family Stated Goal Be back to "normal"      General Information   HPI Pt is a 25 yo male w/ diagnosis of TBI from falling down the stairs. EtOH level 323 on admission to ED.  Bilateral SAH, SDH, multiple skull fractures, bleeding from ear.      Balance Screen   Has the patient fallen in the past 6 months Yes    How many times? 1   (Accident on 09/10/20) No other falls.   Has the patient had a decrease in activity level because of a fear of falling?  No    Is the patient reluctant to leave their home because of a fear of falling?  No      Prior Functional Status   Cognitive/Linguistic Baseline Within functional limits    Type of Home Other(Comment)    Available Support  Family      Cognition   Overall Cognitive Status Impaired/Different from baseline    Area of Impairment Attention;Memory    Scientist, physiological Comprehension   Overall Auditory Comprehension Appears within functional limits for tasks assessed      Verbal Expression   Overall Verbal Expression Appears within functional limits for tasks assessed    Other Verbal Expression Comments Reports occasional anomia      Standardized Assessments   Standardized Assessments  Other Assessment    Other Assessment SLUMS; Portions of CLQT                           SLP Education - 11/08/20 1436    Education Details Provided edu on speech therapy scope    Person(s) Educated Patient;Parent(s)    Methods Explanation    Comprehension Verbalized understanding            SLP Short Term Goals - 11/08/20 1444      SLP SHORT TERM GOAL #1   Title Verbalize 3+ memory strategies that  assist with recall of important information.    Time 2    Period Weeks    Status New      SLP SHORT TERM GOAL #2   Title Verbalize 3+ attention strategies that help reduce cognitive fatigue/load.    Time 2    Period Weeks    Status New      SLP SHORT TERM GOAL #3   Title Verbalize 3+ word finding strategies that assist with anomia.    Time 2    Period Weeks    Status New            SLP Long Term Goals - 11/08/20 1443      SLP LONG TERM GOAL #1   Title Report use of memory strategies at home to assist with ADLs.    Time 4    Period Weeks    Status New      SLP LONG TERM GOAL #2   Title Report use of attention strategies at home to assist with ADLs.    Time 4    Period Weeks    Status New      SLP LONG TERM GOAL #3   Title Report use of word finding strategies at home to assist with ADLs.    Time 4    Period Weeks    Status New            Plan - 11/08/20 1437    Clinical Impression Statement Pt is a 25 yo male s/p TBI on 09/10/20. Pt and mother believe  the patient has been doing well and continues to improve. Pt reports he has occasional dificulty with word finding, memory, and attention (family reported previous diagnosis of ADHD - medication management began after TBI). SLUMS was adminstered to pt - he received a score of 30/30. CLQT clock drawing subtest (13/13). Pt was noted to have some difficulty with attention and thought organization at the conversational level. Further assessment warranted. Pt would benefit from skilled speech services to address compensatory therapy to address c/o word finding, memory, and attention.    Speech Therapy Frequency 1x /week    Duration 4 weeks    Treatment/Interventions Functional tasks;Patient/family education;Cueing hierarchy;Environmental controls;Cognitive reorganization;Language facilitation;Compensatory techniques;Internal/external aids;SLP instruction and feedback;Multimodal communcation approach;Compensatory strategies    Potential to Achieve Goals Good    Consulted and Agree with Plan of Care Patient;Family member/caregiver    Family Member Consulted Mother           Patient will benefit from skilled therapeutic intervention in order to improve the following deficits and impairments:   Cognitive communication deficit    Problem List Patient Active Problem List   Diagnosis Date Noted  . 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 Benign paroxysmal positional vertigo 10/25/2020  . Open fracture of temporal bone (HCC)   . ICH (intracerebral hemorrhage) (HCC) 09/10/2020    Michelene Gardener [place] MS, [place], CBIS  11/08/2020, 2:47 PM  [facility] [address] [place], [place], [address] Phone: [phone]   Fax:  [phone]  Name: Elijah [name] MRN: [id number] Date of Birth: May 04, 1996

## 2020-11-08 NOTE — Patient Instructions (Signed)
Access Code: [email or web address] URL: [email or web address] Date: 11/08/2020 Prepared by: Claude Manges  Exercises Tandem Stance - 1 x daily - 7 x weekly - 10 reps - 10 seconds. hold Single Leg Stance with Support - 1 x daily - 7 x weekly - 10 reps - 10 sec hold Standing Balance in Corner with Eyes Closed - 1 x daily - 7 x weekly - 3 sets - 10 reps Hip Abduction with Resistance Loop - 1 x daily - 7 x weekly - 3 sets - 10 reps Marching with Resistance - 1 x daily - 7 x weekly - 3 sets - 10 reps Tandem Walking with Counter Support - 1 x daily - 7 x weekly - 3 sets - 10 reps  Red TB loop given

## 2020-11-08 NOTE — Therapy (Signed)
[facility] [address] [place], [place], [address] Phone: [phone]   Fax:  [phone]  Physical Therapy Evaluation  Patient Details  Name: Elijah [name] MRN: [id number] Date of Birth: [birth year] Referring Provider (PT): Ranelle OysterSwartz, Zachary T, MD   Encounter Date: 11/08/2020   PT End of Session - 11/08/20 1511    Visit Number 1    Number of Visits 8    Date for PT Re-Evaluation 01/03/21    Authorization Type BCBS: 30 visits PT/OT combined    PT Start Time 1315    PT Stop Time 1400    PT Time Calculation (min) 45 min    Activity Tolerance Patient tolerated treatment well    Behavior During Therapy [facility] for tasks assessed/performed           Past Medical History:  Diagnosis Date  . ADD (attention deficit disorder)     History reviewed. No pertinent surgical history.  There were no vitals filed for this visit.    Subjective Assessment - 11/08/20 1310    [facility], [facility] after fall down stairs.  Reports not remembering the fall.  Upon arrival patient was found to have bilateral subarachnoid hemorrhages, subdural hematoma, multiple skull fractures including left temporal bone traversing the middle ear. Also COVID +. Developed shoulder pain with possible left  ACJ separation but says it is slowly getting better. Has followed up with ENT for hearing loss. Has been feeling off balance since the accident. Some light and sound sensitivity.    Patient is accompained by: Family member   Mother   Pertinent History left hearing loss, hx previous concussion about 6-7 yrs ago, high BP    Patient Stated Goals to feel more steady on his feet    Currently in Pain? No/denies   Gets left shoulder pain with certain mvmts             [facility] PT Assessment - 11/08/20 1310      Assessment   Medical Diagnosis Focal TBI with LOC, Vertigo    Referring Provider (PT) Ranelle OysterSwartz, Zachary T, MD    Onset Date/Surgical Date  09/10/20      Balance Screen   Has the patient fallen in the past 6 months Yes      Home Environment   Additional Comments Lives in an apartment, 2 story. with roomate. Both bedrooms on 2nd flr.      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      Strength   Overall Strength Comments Left shoulder grossly 4+/5 some discomfort with resisted flexion      Transfers   Comments 30 second sit to stand: 11 repetitions   norm: 20+ repetitions     Ambulation/Gait   Gait Pattern Trendelenburg;Lateral trunk lean to left;Wide base  of support    Stairs Yes    Stairs Assistance 7: Independent    Stair Management Technique No rails;Alternating pattern    Height of Stairs --   4 and 6 inch     Balance   Balance comment  Modified Clinical Test of Sensory Interaction in Balance (MCTSIB) Feet together/Firm:  EO: 30 sec normal, EC: 30 sec mod sway, more difficult.  On foam. EO 30 sec min sway, EC 15 sec max sway.  Total Score: 95/120 (Failure to maintain balance in conditions 3 and 4 indicate that the visual and/or vestibular system is not being used to maintain balance.)  Tandem walking: Multiple LOB with heel/toe walking (LOB ot t the L>R), CGA- CS, good stepping reaction noted with LOB.  Backward walking: decreased speed of mvmvt but symmetrical and without LOB.        Standardized Balance Assessment   Standardized Balance Assessment Dynamic Gait Index       Dynamic Gait Index   Level Surface Normal    Change in Gait Speed Normal    Gait with Horizontal Head Turns Mild Impairment    Gait with Vertical Head Turns Mild Impairment    Gait and Pivot Turn Mild Impairment    Step Over Obstacle Moderate Impairment   LOB to the left   Step Around Obstacles Normal    Steps Normal    Total Score 19    DGI comment: 19/24 - predictive of falls                  Vestibular Assessment - 11/08/20 1338      Vestibular Assessment   General Observation Smooth pursuits and saccades WNL other than Some undershoot with vertical/upward motions (asx). VOR WNL.              Objective measurements completed on examination: See above findings.               PT Education - 11/08/20 1510    Education Details Initial PT POC, HEP. Access Code: [email or web address]  with red TB loop    Person(s) Educated Patient;Parent(s)    Methods Explanation;Demonstration    Comprehension Verbalized understanding;Returned demonstration            PT Short Term Goals - 11/08/20 1626      PT SHORT TERM GOAL #1   Title Independent with initial HEP    Time 2    Period Weeks    Status New    Target Date 11/22/20             PT Long Term Goals - 11/08/20 1530      PT LONG TERM GOAL #1   Title FOTO to at least 90%    Time 8    Period Weeks    Status New    Target Date 01/03/21      PT LONG TERM GOAL #2   Title pain free end range shoulder ROM with ADLs and lifting activities    Time 8    Period Weeks    Status New    Target Date 01/03/21      PT LONG TERM GOAL #3   Title Pt will report no dizziness or unsteadiness on feet with prolonged walking in busy environments    Time 8    Period Weeks    Status New    Target Date 01/03/21      PT LONG TERM GOAL #4   Title Pt will score >/=23/24  on the DGI to demonstrate improved functional higher level balance.    Time 8    Period Weeks    Status New    Target Date 01/03/21      PT LONG  TERM GOAL #5   Title Pt will be able to maintain balance for 30 seconds with minial to no sway for all conditions of the MCTSIB to demonstrate improved balance with varied vestibular, visual and somatosensory input.    Time 8    Period Weeks    Status New    Target Date 01/03/21                  Plan - 11/08/20 1512    Clinical Impression Statement Pt is a 24 yo male who presents 2 months s/p TBI with LOC after fall down stairs at home, at which time he sustained bilateral subarachnoid hemorrhages, subdural hematoma, multiple skull fractures including left temporal bone traversing the middle ear, in addition to left shoulder pain with possible left ACJ separation. He reports initially getting some dizziness which has decreased, but still does not feel completely steady on his feet. VOR was screened and generally WNL and asymptomatic, only with minimal undershooting with vertical tracking. He scored 19/24 on the DGI with LOB noted particularly with walking with head turns and stepping over obstacles. Also with increased difficulty maintaining balance on uneven surfaces without visual input, likely related to inner ear injuries he sustained. Left arm ROM is grossly WNL, slight weakness and discomfort reported with resistance.  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 Elijah [name] will benefit from skilled physcial therapy to work towards improving functional UE strength, postural stability and steadiness on feet to facilitate return to PLOF.    Examination-Participation Restrictions Occupation;Driving    Stability/Clinical Decision Making Evolving/Moderate complexity    Clinical Decision Making Moderate    Rehab Potential Good    PT Frequency 1x / week    PT Duration 8 weeks   (5-8 weeks based on progress)   PT Treatment/Interventions ADLs/Self Care Home Management;Cryotherapy;Electrical Stimulation;Moist Heat;Canalith Repostioning;Neuromuscular  re-education;Balance training;Therapeutic exercise;Therapeutic activities;Functional mobility training;Patient/family education;Manual techniques;Vestibular;Taping    PT Next Visit Plan Reassess HEP and add some UE strengthening and core stab as tolerated. Higher level balance challenges and strengthening as tolerated. Endurance training. May further assess vestibular   PT Home Exercise Plan see pt edu    Consulted and Agree with Plan of Care Patient;Family member/caregiver    Family Member Consulted mother           Patient will benefit from skilled therapeutic intervention in order to improve the following deficits and impairments:  Abnormal gait,Dizziness,Pain,Impaired flexibility,Decreased balance,Decreased strength  Visit Diagnosis: Unsteadiness on feet - Plan: PT plan of care cert/re-cert  Dizziness and giddiness - Plan: PT plan of care cert/re-cert  Muscle weakness (generalized) - Plan: PT plan of care cert/re-cert  Acute pain of left shoulder - Plan: PT plan of care cert/re-cert  History of falling - Plan: PT plan of care cert/re-cert     Problem List Patient Active Problem List   Diagnosis Date Noted  . 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 Benign paroxysmal positional vertigo 10/25/2020  . Open fracture of temporal bone (HCC)   . ICH (intracerebral hemorrhage) (HCC) 09/10/2020    Anson Crofts, PT, DPT 11/08/2020, 4:43 PM  [facility] [address] [place], [place], [address] Phone: [phone]   Fax:  [phone]  Name: Elijah [name] MRN: [id number] Date of Birth: 04/23/1996

## 2020-11-14 ENCOUNTER — Ambulatory Visit: Payer: BC Managed Care – PPO | Admitting: Physical Therapy

## 2020-11-14 ENCOUNTER — Encounter: Payer: Self-pay | Admitting: Physical Therapy

## 2020-11-14 ENCOUNTER — Encounter: Payer: Self-pay | Admitting: Speech Pathology

## 2020-11-14 ENCOUNTER — Ambulatory Visit: Payer: BC Managed Care – PPO | Admitting: Speech Pathology

## 2020-11-14 ENCOUNTER — Other Ambulatory Visit: Payer: Self-pay

## 2020-11-14 DIAGNOSIS — R2681 Unsteadiness on feet: Secondary | ICD-10-CM | POA: Diagnosis not present

## 2020-11-14 DIAGNOSIS — M6281 Muscle weakness (generalized): Secondary | ICD-10-CM

## 2020-11-14 DIAGNOSIS — R41841 Cognitive communication deficit: Secondary | ICD-10-CM

## 2020-11-14 DIAGNOSIS — Z9181 History of falling: Secondary | ICD-10-CM

## 2020-11-14 DIAGNOSIS — M25512 Pain in left shoulder: Secondary | ICD-10-CM

## 2020-11-14 DIAGNOSIS — R42 Dizziness and giddiness: Secondary | ICD-10-CM

## 2020-11-14 NOTE — Therapy (Signed)
[place]. [place], [place], [address] Phone: [phone]   Fax:  [phone]  Speech Language Pathology Treatment  Patient Details  Name: Elijah [name] MRN: [id number] Date of Birth: December 29, 1995 Referring Provider (SLP): Meredith Staggers, MD   Encounter Date: 11/14/2020   End of Session - 11/14/20 1053    Visit Number 2    Number of Visits 3    Date for SLP Re-Evaluation 12/08/20    SLP Start Time 1021    SLP Stop Time  1059    SLP Time Calculation (min) 38 min    Activity Tolerance Patient tolerated treatment well           Past Medical History:  Diagnosis Date  . ADD (attention deficit disorder)     History reviewed. No pertinent surgical history.  There were no vitals filed for this visit.   Subjective Assessment - 11/14/20 1025    Subjective Pt reports everything is going well and he is slowly improving.    Patient is accompained by: Family member    Currently in Pain? No/denies                 ADULT SLP TREATMENT - 11/14/20 1050      General Information   Behavior/Cognition Alert;Cooperative      Treatment Provided   Treatment provided Cognitive-Linquistic      Cognitive-Linquistic Treatment   Treatment focused on Patient/family/caregiver education    Skilled Treatment Trained patient in attention, memory, and word finding strategies Pt reported he plans to use some of these strategies at home if needed. Currently, he doesn't feel like he is having many difficulties. SLP told patient to reach out to doctor if he feels like any difficulties arise after cognitive demands increase.      Assessment / Recommendations / Plan   Plan Continue with current plan of care      Progression Toward Goals   Progression toward goals Progressing toward goals              SLP Short Term Goals - 11/14/20 1056      SLP SHORT TERM GOAL #1   Title Verbalize 3+ memory strategies that assist  with recall of important information.    Time 2    Period Weeks    Status Achieved      SLP SHORT TERM GOAL #2   Title Verbalize 3+ attention strategies that help reduce cognitive fatigue/load.    Time 2    Period Weeks    Status Achieved      SLP SHORT TERM GOAL #3   Title Verbalize 3+ word finding strategies that assist with anomia.    Time 2    Period Weeks    Status Achieved            SLP Long Term Goals - 11/14/20 1056      SLP LONG TERM GOAL #1   Title Report use of memory strategies at home to assist with ADLs.    Time 4    Period Weeks    Status Achieved      SLP LONG TERM GOAL #2   Title Report use of attention strategies at home to assist with ADLs.    Time 4    Period Weeks    Status Achieved      SLP LONG TERM GOAL #3   Title Report use of word finding strategies at home to assist with ADLs.  Time 4    Period Weeks    Status Achieved            Plan - 11/14/20 1056    Clinical Impression Statement Pt is a 25 yo male s/p TBI on 09/10/20. Pt and mother believe the patient has been doing well and continues to improve. Pt reports he has occasional dificulty with word finding, memory, and attention (family reported previous diagnosis of ADHD - medication management began after TBI). SLUMS was adminstered to pt - he received a score of 30/30. CLQT clock drawing subtest (13/13). Pt was noted to have some difficulty with attention and thought organization at the conversational level. Further assessment warranted. Pt would benefit from skilled speech services to address compensatory therapy to address c/o word finding, memory, and attention.    Speech Therapy Frequency 1x /week    Duration 4 weeks    Treatment/Interventions Functional tasks;Patient/family education;Cueing hierarchy;Environmental controls;Cognitive reorganization;Language facilitation;Compensatory techniques;Internal/external aids;SLP instruction and feedback;Multimodal communcation  approach;Compensatory strategies    Potential to Achieve Goals Good    Consulted and Agree with Plan of Care Patient;Family member/caregiver    Family Member Consulted Mother           Patient will benefit from skilled therapeutic intervention in order to improve the following deficits and impairments:   Cognitive communication deficit    Problem List Patient Active Problem List   Diagnosis Date Noted  . 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 Benign paroxysmal positional vertigo 10/25/2020  . Open fracture of temporal bone ([place])   . ICH (intracerebral hemorrhage) ([place]) 09/10/2020   .SPEECH THERAPY DISCHARGE SUMMARY  Visits from Start of Care: 2  Current functional level related to goals / functional outcomes: WFL   Remaining deficits: Attention; being treated for ADD   Education / Equipment: Provided education on attention, memory, and word finding.  Plan: Patient agrees to discharge.  Patient goals were met. Patient is being discharged due to meeting the stated rehab goals.  ?????     [place] MS, [place], CBIS  11/14/2020, 11:02 AM  [place]. [place], [place], [address] Phone: [phone]   Fax:  [phone]   Name: Elijah [name] MRN: [id number] Date of Birth: 04/11/1996

## 2020-11-14 NOTE — Therapy (Signed)
[facility] [address] [place], [place], [address] Phone: [phone]   Fax:  [phone]  Physical Therapy Treatment  Patient Details  Name: Elijah [name] MRN: [id number] Date of Birth: [birth year] Referring Provider (PT): Ranelle Oyster, MD   Encounter Date: 11/14/2020   PT End of Session - 11/14/20 1139    Visit Number 2    Number of Visits 8    Date for PT Re-Evaluation 01/03/21    Authorization Type BCBS: 30 visits PT/OT combined    PT Start Time 1058    PT Stop Time 1140    PT Time Calculation (min) 42 min    Activity Tolerance Patient tolerated treatment well    Behavior During Therapy [facility] for tasks assessed/performed           Past Medical History:  Diagnosis Date  . ADD (attention deficit disorder)     History reviewed. No pertinent surgical history.  There were no vitals filed for this visit.   Subjective Assessment - 11/14/20 1100    Subjective "Feeling pretty good"    Pertinent History left hearing loss, hx previous concussion about 6-7 yrs ago, high BP    Currently in Pain? No/denies                             [facility] Adult PT Treatment/Exercise - 11/14/20 0001      High Level Balance   High Level Balance Activities Tandem walking    High Level Balance Comments side step on airex, side step over foam roll onto aires, Side step on balance beam airex      Exercises   Exercises Lumbar;Shoulder;Knee/Hip      Lumbar Exercises: Aerobic   Recumbent Bike L4 x 6 min      Lumbar Exercises: Machines for Strengthening   Other Lumbar Machine Exercise Rows & Lats 35lb 2x10      Lumbar Exercises: Standing   Other Standing Lumbar Exercises 6'' step ups x 10 each    Other Standing Lumbar Exercises resisted gait 40way 40lb x 3 each      Knee/Hip Exercises: Machines for Strengthening   Cybex Knee Extension 10lb 2x12    Cybex Knee Flexion 35lb 2x12      Shoulder Exercises: Standing    Flexion Strengthening;Both;20 reps;Weights   on airex   Shoulder Flexion Weight (lbs) 4    ABduction Strengthening;Both;20 reps;Weights   on airex   Shoulder ABduction Weight (lbs) 4                    PT Short Term Goals - 11/14/20 1143      PT SHORT TERM GOAL #1   Title Independent with initial HEP    Status Partially Met             PT Long Term Goals - 11/08/20 1530      PT LONG TERM GOAL #1   Title FOTO to at least 90%    Time 8    Period Weeks    Status New    Target Date 01/03/21      PT LONG TERM GOAL #2   Title pain free end range shoulder ROM with ADLs and lifting activities    Time 8    Period Weeks    Status New    Target Date 01/03/21      PT LONG TERM GOAL #3  Title Pt will report no dizziness or unsteadiness on feet with prolonged walking in busy environments    Time 8    Period Weeks    Status New    Target Date 01/03/21      PT LONG TERM GOAL #4   Title Pt will score >/=23/24 on the DGI to demonstrate improved functional higher level balance.    Time 8    Period Weeks    Status New    Target Date 01/03/21      PT LONG TERM GOAL #5   Title Pt will be able to maintain balance for 30 seconds with minial to no sway for all conditions of the MCTSIB to demonstrate improved balance with varied vestibular, visual and somatosensory input.    Time 8    Period Weeks    Status New    Target Date 01/03/21                 Plan - 11/14/20 1140    Clinical Impression Statement Pt tolerated an initial progression toe E well. Some instability noted on non compliant surfaces. Cues to control the eccentric phase with resisted gait needed. Some LOB noted with tandem walking but able to self correct. Good strength and ROM noted on all machine level interventions.    Examination-Participation Restrictions Occupation;Driving    Stability/Clinical Decision Making Evolving/Moderate complexity    Rehab Potential Good    PT Frequency 1x / week     PT Duration 8 weeks    PT Treatment/Interventions ADLs/Self Care Home Management;Cryotherapy;Electrical Stimulation;Moist Heat;Canalith Repostioning;Neuromuscular re-education;Balance training;Therapeutic exercise;Therapeutic activities;Functional mobility training;Patient/family education;Manual techniques;Vestibular;Taping    PT Next Visit Plan UE strengthening and core stab as tolerated. Higher level balance challenges and strengthening as tolerated. Endurance training.           Patient will benefit from skilled therapeutic intervention in order to improve the following deficits and impairments:  Abnormal gait,Dizziness,Pain,Impaired flexibility,Decreased balance,Decreased strength  Visit Diagnosis: Cognitive communication deficit  Unsteadiness on feet  Dizziness and giddiness  Muscle weakness (generalized)  Acute pain of left shoulder  History of falling     Problem List Patient Active Problem List   Diagnosis Date Noted  . 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 Benign paroxysmal positional vertigo 10/25/2020  . Open fracture of temporal bone ([place])   . ICH (intracerebral hemorrhage) ([place]) 09/10/2020    Scot Jun, PTA 11/14/2020, 11:43 AM  [place]. [place], [place], [address] Phone: [phone]   Fax:  [phone]  Name: Elijah [name] MRN: [id number] Date of Birth: 08-31-1995

## 2020-11-22 ENCOUNTER — Other Ambulatory Visit: Payer: Self-pay

## 2020-11-22 ENCOUNTER — Ambulatory Visit: Payer: BC Managed Care – PPO | Attending: Physical Medicine & Rehabilitation

## 2020-11-22 DIAGNOSIS — R42 Dizziness and giddiness: Secondary | ICD-10-CM | POA: Insufficient documentation

## 2020-11-22 DIAGNOSIS — M6281 Muscle weakness (generalized): Secondary | ICD-10-CM | POA: Insufficient documentation

## 2020-11-22 DIAGNOSIS — R2681 Unsteadiness on feet: Secondary | ICD-10-CM | POA: Diagnosis present

## 2020-11-22 DIAGNOSIS — M25512 Pain in left shoulder: Secondary | ICD-10-CM | POA: Diagnosis present

## 2020-11-22 DIAGNOSIS — Z9181 History of falling: Secondary | ICD-10-CM | POA: Insufficient documentation

## 2020-11-22 NOTE — Therapy (Signed)
[place]. [place], [place], [address] Phone: [phone]   Fax:  [phone]  Physical Therapy Treatment  Patient Details  Name: Elijah [name] MRN: [id number] Date of Birth: [birth year] Referring Provider (PT): Meredith Staggers, MD   Encounter Date: 11/22/2020   PT End of Session - 11/22/20 1537    Visit Number 3    Number of Visits 8    Date for PT Re-Evaluation 01/03/21    Authorization Type BCBS: 30 visits PT/OT combined    PT Start Time 5885    PT Stop Time 1610    PT Time Calculation (min) 40 min    Activity Tolerance Patient tolerated treatment well    Behavior During Therapy [facility] for tasks assessed/performed           Past Medical History:  Diagnosis Date  . ADD (attention deficit disorder)     History reviewed. No pertinent surgical history.  There were no vitals filed for this visit.   Subjective Assessment - 11/22/20 1537    Subjective Did a small hike, could feel hard for balance but did okay no falls    Pertinent History left hearing loss, hx previous concussion about 6-7 yrs ago, high BP    Patient Stated Goals to feel more steady on his feet    Currently in Pain? No/denies                             [facility] Adult PT Treatment/Exercise - 11/22/20 0001      High Level Balance   High Level Balance Activities Tandem walking;Marching forwards;Marching backwards    High Level Balance Comments Side step on airex, step up vs over 4" step, on/off firm foam. tandem walk on foam with cone pick ups fwd/bwd (very difficult alot of sway (needed CGA/CS)      Lumbar Exercises: Machines for Strengthening   Leg Press 40# mod SL 10 x 2 B      Knee/Hip Exercises: Standing   Lateral Step Up Both;2 sets;10 reps;Hand Hold: 0;Step Height: 8"   step up with opp knee up (with + airex on top). harder on the left   Forward Step Up Both;3 sets;5 reps;Step Height: 8"   step up with opp knee up  (with + airex on top). harder on the left   Walking with Sports Cord 50# x 3 each direction      Knee/Hip Exercises: Seated   Other Seated Knee/Hip Exercises Seated op arm and leg lift x10                   PT Short Term Goals - 11/14/20 1143      PT SHORT TERM GOAL #1   Title Independent with initial HEP    Status Partially Met             PT Long Term Goals - 11/08/20 1530      PT LONG TERM GOAL #1   Title FOTO to at least 90%    Time 8    Period Weeks    Status New    Target Date 01/03/21      PT LONG TERM GOAL #2   Title pain free end range shoulder ROM with ADLs and lifting activities    Time 8    Period Weeks    Status New    Target Date 01/03/21  PT LONG TERM GOAL #3   Title Pt will report no dizziness or unsteadiness on feet with prolonged walking in busy environments    Time 8    Period Weeks    Status New    Target Date 01/03/21      PT LONG TERM GOAL #4   Title Pt will score >/=23/24 on the DGI to demonstrate improved functional higher level balance.    Time 8    Period Weeks    Status New    Target Date 01/03/21      PT LONG TERM GOAL #5   Title Pt will be able to maintain balance for 30 seconds with minial to no sway for all conditions of the MCTSIB to demonstrate improved balance with varied vestibular, visual and somatosensory input.    Time 8    Period Weeks    Status New    Target Date 01/03/21                 Plan - 11/22/20 1538    Clinical Impression Statement Pt tolerated prgression with balance challenges well. Instability moreso on LLE > RLE.  Cues to control the eccentric phase with resisted gait needed. Progressed balance exercises with more compliant surfaces today and obstacle course, some instability with tandem on airex requiring guarding, in addiiton to increased sway with alteral step overs.    Examination-Participation Restrictions Occupation;Driving    Stability/Clinical Decision Making  Evolving/Moderate complexity    Rehab Potential Good    PT Frequency 1x / week    PT Duration 8 weeks    PT Treatment/Interventions ADLs/Self Care Home Management;Cryotherapy;Electrical Stimulation;Moist Heat;Canalith Repostioning;Neuromuscular re-education;Balance training;Therapeutic exercise;Therapeutic activities;Functional mobility training;Patient/family education;Manual techniques;Vestibular;Taping    PT Next Visit Plan UE strengthening and core stab as tolerated. Higher level balance challenges and strengthening as tolerated. Endurance training.    Consulted and Agree with Plan of Care Patient           Patient will benefit from skilled therapeutic intervention in order to improve the following deficits and impairments:  Abnormal gait,Dizziness,Pain,Impaired flexibility,Decreased balance,Decreased strength  Visit Diagnosis: Unsteadiness on feet  Acute pain of left shoulder  Muscle weakness (generalized)  History of falling  Dizziness and giddiness     Problem List Patient Active Problem List   Diagnosis Date Noted  . 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 Benign paroxysmal positional vertigo 10/25/2020  . Open fracture of temporal bone ([place])   . ICH (intracerebral hemorrhage) ([place]) 09/10/2020    Hall Busing, PT, DPT 11/22/2020, 4:14 PM  [place]. [place], [place], [address] Phone: [phone]   Fax:  [phone]  Name: RILEN SHUKLA MRN: [id number] Date of Birth: 18-Apr-1996

## 2020-11-24 ENCOUNTER — Telehealth: Payer: Self-pay | Admitting: *Deleted

## 2020-11-24 DIAGNOSIS — S06303S Unspecified focal traumatic brain injury with loss of consciousness of 1 hour to 5 hours 59 minutes, sequela: Secondary | ICD-10-CM

## 2020-11-24 MED ORDER — AMPHETAMINE-DEXTROAMPHETAMINE 5 MG PO TABS: 5.0000 mg | ORAL_TABLET | Freq: Two times a day (BID) | ORAL | 0 refills | Status: DC

## 2020-11-24 NOTE — Telephone Encounter (Signed)
Mr Lorenza Cambridge is calling for a refill on his Adderall.  Last fill date was 10/25/20 per pmp.

## 2020-11-24 NOTE — Telephone Encounter (Signed)
RF sent. I assume that it is helping him?

## 2020-11-29 ENCOUNTER — Ambulatory Visit: Payer: BC Managed Care – PPO

## 2020-11-29 ENCOUNTER — Other Ambulatory Visit: Payer: Self-pay

## 2020-11-29 DIAGNOSIS — R2681 Unsteadiness on feet: Secondary | ICD-10-CM | POA: Diagnosis not present

## 2020-11-29 DIAGNOSIS — M25512 Pain in left shoulder: Secondary | ICD-10-CM

## 2020-11-29 DIAGNOSIS — R42 Dizziness and giddiness: Secondary | ICD-10-CM

## 2020-11-29 DIAGNOSIS — Z9181 History of falling: Secondary | ICD-10-CM

## 2020-11-29 DIAGNOSIS — M6281 Muscle weakness (generalized): Secondary | ICD-10-CM

## 2020-11-29 NOTE — Therapy (Signed)
[place]. [place], [place], [address] Phone: [phone]   Fax:  [phone]  Physical Therapy Treatment  Patient Details  Name: Elijah [name] MRN: [id number] Date of Birth: 29-Oct-1995 Referring Provider (PT): Meredith Staggers, MD   Encounter Date: 11/29/2020   PT End of Session - 11/29/20 1614    Visit Number 4    Number of Visits 8    Date for PT Re-Evaluation 01/03/21    Authorization Type BCBS: 30 visits PT/OT combined    PT Start Time 5009    PT Stop Time 1612    PT Time Calculation (min) 42 min    Activity Tolerance Patient tolerated treatment well    Behavior During Therapy [facility] for tasks assessed/performed           Past Medical History:  Diagnosis Date  . ADD (attention deficit disorder)     History reviewed. No pertinent surgical history.  There were no vitals filed for this visit.   Subjective Assessment - 11/29/20 1613    Subjective feeling good. still not feeling totally 100% but balance is better    Pertinent History left hearing loss, hx previous concussion about 6-7 yrs ago, high BP    Patient Stated Goals to feel more steady on his feet    Currently in Pain? No/denies                             [facility] Adult PT Treatment/Exercise - 11/29/20 0001      High Level Balance   High Level Balance Activities Negotitating around obstacles;Negotiating over obstacles    High Level Balance Comments Side step on airex, step up vs over 4" step, on/off firm foam. tandem walk on foam with cone pick ups fwd/bwd (very difficult alot of sway (needed CGA/CS) forward stepping vs lateral stepping onto dynadiscs      Lumbar Exercises: Aerobic   Nustep L7 x 4 min      Knee/Hip Exercises: Standing   Forward Step Up Both;2 sets;10 reps;Step Height: 8"   step up with opp knee up (with + airex on top). harder on the left   Walking with Sports Cord 30# lateral step over foam roll x 3 each  side. forward step up 6" x 10 each leg against cord. Lateral step up x 3 each leg.           BOSU squat with 1 HH suport and CS x10 - very unstable a lot of sway but no LOB         PT Short Term Goals - 11/14/20 1143      PT SHORT TERM GOAL #1   Title Independent with initial HEP    Status Partially Met             PT Long Term Goals - 11/08/20 1530      PT LONG TERM GOAL #1   Title FOTO to at least 90%    Time 8    Period Weeks    Status New    Target Date 01/03/21      PT LONG TERM GOAL #2   Title pain free end range shoulder ROM with ADLs and lifting activities    Time 8    Period Weeks    Status New    Target Date 01/03/21      PT LONG TERM GOAL #3   Title Pt will  report no dizziness or unsteadiness on feet with prolonged walking in busy environments    Time 8    Period Weeks    Status New    Target Date 01/03/21      PT LONG TERM GOAL #4   Title Pt will score >/=23/24 on the DGI to demonstrate improved functional higher level balance.    Time 8    Period Weeks    Status New    Target Date 01/03/21      PT LONG TERM GOAL #5   Title Pt will be able to maintain balance for 30 seconds with minial to no sway for all conditions of the MCTSIB to demonstrate improved balance with varied vestibular, visual and somatosensory input.    Time 8    Period Weeks    Status New    Target Date 01/03/21                 Plan - 11/29/20 1614    Clinical Impression Statement Continues to tolerate balance challenge progression nicely. Was able to tolerate multitaksing walking with head turns and ball catches with random commands without LOB or chang ein gait. Negotiation of obstacles has improved significantly. Still with some SL instability on the LLE>RLE but slightly improving. Pt concerned about ability to balance while holding heaving things as with work duties - plan to incorporate balance with carry and lifting various weighted objects, with/without  perturbations next visit    Rehab Potential Good    PT Frequency 1x / week    PT Treatment/Interventions ADLs/Self Care Home Management;Cryotherapy;Electrical Stimulation;Moist Heat;Canalith Repostioning;Neuromuscular re-education;Balance training;Therapeutic exercise;Therapeutic activities;Functional mobility training;Patient/family education;Manual techniques;Vestibular;Taping    PT Next Visit Plan UE strengthening and core stab as tolerated. Higher level balance challenges and strengthening as tolerated. Endurance training. Heavier lift/carry with balance challenges next visit.           Patient will benefit from skilled therapeutic intervention in order to improve the following deficits and impairments:     Visit Diagnosis: Unsteadiness on feet  Acute pain of left shoulder  Muscle weakness (generalized)  History of falling  Dizziness and giddiness     Problem List Patient Active Problem List   Diagnosis Date Noted  . 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 Benign paroxysmal positional vertigo 10/25/2020  . Open fracture of temporal bone ([place])   . ICH (intracerebral hemorrhage) ([place]) 09/10/2020    Hall Busing , PT, DPT 11/29/2020, 4:20 PM  [place]. [place], [place], [address] Phone: [phone]   Fax:  [phone]  Name: Elijah [name] MRN: [id number] Date of Birth: [birth year]

## 2020-12-06 ENCOUNTER — Ambulatory Visit: Payer: BC Managed Care – PPO

## 2020-12-13 ENCOUNTER — Other Ambulatory Visit: Payer: Self-pay

## 2020-12-13 ENCOUNTER — Ambulatory Visit: Payer: BC Managed Care – PPO

## 2020-12-13 DIAGNOSIS — R2681 Unsteadiness on feet: Secondary | ICD-10-CM

## 2020-12-13 DIAGNOSIS — M6281 Muscle weakness (generalized): Secondary | ICD-10-CM

## 2020-12-13 DIAGNOSIS — Z9181 History of falling: Secondary | ICD-10-CM

## 2020-12-13 DIAGNOSIS — M25512 Pain in left shoulder: Secondary | ICD-10-CM

## 2020-12-13 DIAGNOSIS — R42 Dizziness and giddiness: Secondary | ICD-10-CM

## 2020-12-13 NOTE — Therapy (Signed)
[place]. [place], [place], [address] Phone: [phone]   Fax:  [phone]  Physical Therapy Treatment  Patient Details  Name: Elijah [name] MRN: [id number] Date of Birth: [birth year] Referring Provider (PT): Meredith Staggers, MD   Encounter Date: 12/13/2020   PT End of Session - 12/13/20 1525    Visit Number 5    Number of Visits 8    Date for PT Re-Evaluation 01/03/21    Authorization Type BCBS: 30 visits PT/OT combined    PT Start Time 1527    PT Stop Time 1610    PT Time Calculation (min) 43 min    Activity Tolerance Patient tolerated treatment well    Behavior During Therapy [facility] for tasks assessed/performed           Past Medical History:  Diagnosis Date  . ADD (attention deficit disorder)     History reviewed. No pertinent surgical history.  There were no vitals filed for this visit.   Subjective Assessment - 12/13/20 1524    Subjective starting to feel more towards normal. Sees Dr Naaman Plummer on the 8th    Pertinent History left hearing loss, hx previous concussion about 6-7 yrs ago, high BP    Patient Stated Goals to feel more steady on his feet    Currently in Pain? No/denies                             [facility] Adult PT Treatment/Exercise - 12/13/20 0001      High Level Balance   High Level Balance Activities Negotitating around obstacles;Negotiating over obstacles    High Level Balance Comments stepping up and over obstacles holding 10# in one arm, 6# in another, CS.  [place] rows and ext red TB 15 x 2 standing on airex.  semitandem on airex during shoulder horizontal abd red TB 15 x 2,  scaption 3# 10 x 2. OHP red med ball x 10, yellow med ball x 10.  negotiating uneven surfaces while carrying uneven/weighted objects.      Lumbar Exercises: Aerobic   Nustep L6 x 7 minutes         Lat pull downs: 25# 2 x 15         PT Education - 12/13/20 1621    Education  Details Updated HEP. Access Code: [place]  URL: [email or web address]  Date: 12/13/2020  Prepared by: Sherlynn Stalls    Exercises  Standing Shoulder Horizontal Abduction with Resistance - 1 x daily - 7 x weekly - 3 sets - 10 reps  Standing Shoulder Row with Anchored Resistance - 1 x daily - 7 x weekly - 3 sets - 10 reps  Shoulder extension with resistance - Neutral - 1 x daily - 7 x weekly - 3 sets - 10 reps. To be done while holding 2 differently weighted objects (1 in each hand):   Standing March - 1 x daily - 7 x weekly - 3 sets - 10 reps  Goblet Squat - 1 x daily - 7 x weekly - 3 sets - 10 reps    Person(s) Educated Patient    Methods Explanation;Demonstration;Handout    Comprehension Verbalized understanding;Returned demonstration            PT Short Term Goals - 11/14/20 1143      PT SHORT TERM GOAL #1   Title Independent with initial HEP    Status Partially Met  PT Long Term Goals - 12/13/20 1625      PT LONG TERM GOAL #1   Title FOTO to at least 90%    Time 8    Period Weeks    Status On-going      PT LONG TERM GOAL #2   Title pain free end range shoulder ROM with ADLs and lifting activities    Time 8    Period Weeks    Status On-going      PT LONG TERM GOAL #3   Title Pt will report no dizziness or unsteadiness on feet with prolonged walking in busy environments    Time 8    Period Weeks    Status On-going      PT LONG TERM GOAL #4   Title Pt will score >/=23/24 on the DGI to demonstrate improved functional higher level balance.    Time 8    Period Weeks    Status On-going      PT LONG TERM GOAL #5   Title Pt will be able to maintain balance for 30 seconds with minial to no sway for all conditions of the MCTSIB to demonstrate improved balance with varied vestibular, visual and somatosensory input.    Time 8    Period Weeks    Status On-going                 Plan - 12/13/20 1534    Clinical Impression Statement Brolin  is overall making good gains in high level balance activities with good balance reacitons. He reports left shoulder also doing okay but does still bother sometimes. 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 Plan to continue to progress balance with dual task UE lift, carry, motions.    Rehab Potential Good    PT Frequency 1x / week    PT Treatment/Interventions ADLs/Self Care Home Management;Cryotherapy;Electrical Stimulation;Moist Heat;Canalith Repostioning;Neuromuscular re-education;Balance training;Therapeutic exercise;Therapeutic activities;Functional mobility training;Patient/family education;Manual techniques;Vestibular;Taping    PT Next Visit Plan UE strengthening and core stab as tolerated. Higher level balance challenges and strengthening with dual task Heavier lift/carry with balance challenges .   May need progress update to be sent to MD next visit   Consulted and Agree with Plan of Care Patient           Patient will benefit from skilled therapeutic intervention in order to improve the following deficits and impairments:     Visit Diagnosis: Unsteadiness on feet  Acute pain of left shoulder  Muscle weakness (generalized)  History of falling  Dizziness and giddiness     Problem List Patient Active Problem List   Diagnosis Date Noted  . 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 Benign paroxysmal positional vertigo 10/25/2020  . Open fracture of temporal bone ([place])   . ICH (intracerebral hemorrhage) ([place]) 09/10/2020    Hall Busing, PT, DPT 12/13/2020, 4:28 PM  [place]. [place], [place], [address] Phone: [phone]   Fax:  [phone]  Name: DORR PERROT MRN: [id number] Date of Birth: 22-Oct-1995

## 2020-12-25 ENCOUNTER — Other Ambulatory Visit: Payer: Self-pay

## 2020-12-25 ENCOUNTER — Encounter: Payer: Self-pay | Admitting: Physical Therapy

## 2020-12-25 ENCOUNTER — Ambulatory Visit: Payer: BC Managed Care – PPO | Attending: Physical Medicine & Rehabilitation | Admitting: Physical Therapy

## 2020-12-25 DIAGNOSIS — M6281 Muscle weakness (generalized): Secondary | ICD-10-CM | POA: Diagnosis present

## 2020-12-25 DIAGNOSIS — R41841 Cognitive communication deficit: Secondary | ICD-10-CM | POA: Diagnosis present

## 2020-12-25 DIAGNOSIS — R2681 Unsteadiness on feet: Secondary | ICD-10-CM | POA: Diagnosis present

## 2020-12-25 DIAGNOSIS — R42 Dizziness and giddiness: Secondary | ICD-10-CM | POA: Insufficient documentation

## 2020-12-25 DIAGNOSIS — M25512 Pain in left shoulder: Secondary | ICD-10-CM | POA: Diagnosis present

## 2020-12-25 DIAGNOSIS — Z9181 History of falling: Secondary | ICD-10-CM | POA: Insufficient documentation

## 2020-12-25 NOTE — Therapy (Signed)
[place]. [place], [place], [address] Phone: [phone]   Fax:  [phone]  Physical Therapy Treatment  Patient Details  Name: Elijah [name] MRN: [id number] Date of Birth: 02-07-1996 Referring Provider (PT): Meredith Staggers, MD   Encounter Date: 12/25/2020   PT End of Session - 12/25/20 1344    Visit Number 6    Date for PT Re-Evaluation 01/03/21    Authorization Type BCBS: 30 visits PT/OT combined    PT Start Time 1300    PT Stop Time 1345    PT Time Calculation (min) 45 min    Activity Tolerance Patient tolerated treatment well    Behavior During Therapy [facility] for tasks assessed/performed           Past Medical History:  Diagnosis Date  . ADD (attention deficit disorder)     History reviewed. No pertinent surgical history.  There were no vitals filed for this visit.   Subjective Assessment - 12/25/20 1310    Subjective "Doing good, no more falls"    Currently in Pain? No/denies                             [facility] Adult PT Treatment/Exercise - 12/25/20 0001      High Level Balance   High Level Balance Activities Side stepping;Backward walking   on balance beam   High Level Balance Comments on aire ball toss, SLS ball toss, Tandem stance ball toss, Tandem stance eyes closes, Alt 8'' box taps from balance beam      Lumbar Exercises: Aerobic   Elliptical L3.5 x 3 min each      Lumbar Exercises: Machines for Strengthening   Leg Press 60lb 2x15    Other Lumbar Machine Exercise Rows & Lats 35lb 2x10                    PT Short Term Goals - 11/14/20 1143      PT SHORT TERM GOAL #1   Title Independent with initial HEP    Status Partially Met             PT Long Term Goals - 12/13/20 1625      PT LONG TERM GOAL #1   Title FOTO to at least 90%    Time 8    Period Weeks    Status On-going      PT LONG TERM GOAL #2   Title pain free end range shoulder ROM  with ADLs and lifting activities    Time 8    Period Weeks    Status On-going      PT LONG TERM GOAL #3   Title Pt will report no dizziness or unsteadiness on feet with prolonged walking in busy environments    Time 8    Period Weeks    Status On-going      PT LONG TERM GOAL #4   Title Pt will score >/=23/24 on the DGI to demonstrate improved functional higher level balance.    Time 8    Period Weeks    Status On-going      PT LONG TERM GOAL #5   Title Pt will be able to maintain balance for 30 seconds with minial to no sway for all conditions of the MCTSIB to demonstrate improved balance with varied vestibular, visual and somatosensory input.    Time 8    Period  Weeks    Status On-going                 Plan - 12/25/20 1347    Clinical Impression Statement Pt did well overall today, and he reports improvement overall. He has some instability with SLS and ball toss. Instability also noted with tandem stance eyes closes. Increase resistance tolerated on leg press. Cues for core engagement needed with seated rows. Reports that he returns to MD on Thursday with the hope tha he will be cleared for work.    Examination-Participation Restrictions Occupation;Driving    Stability/Clinical Decision Making Evolving/Moderate complexity    Rehab Potential Good    PT Frequency 1x / week    PT Duration 8 weeks    PT Treatment/Interventions ADLs/Self Care Home Management;Cryotherapy;Electrical Stimulation;Moist Heat;Canalith Repostioning;Neuromuscular re-education;Balance training;Therapeutic exercise;Therapeutic activities;Functional mobility training;Patient/family education;Manual techniques;Vestibular;Taping    PT Next Visit Plan Asked pt to schedule one mpre apt after MD visit.           Patient will benefit from skilled therapeutic intervention in order to improve the following deficits and impairments:  Abnormal gait,Dizziness,Pain,Impaired flexibility,Decreased balance,Decreased  strength  Visit Diagnosis: Muscle weakness (generalized)  Acute pain of left shoulder  Unsteadiness on feet  History of falling     Problem List Patient Active Problem List   Diagnosis Date Noted  . 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 Benign paroxysmal positional vertigo 10/25/2020  . Open fracture of temporal bone ([place])   . ICH (intracerebral hemorrhage) ([place]) 09/10/2020    Scot Jun, PTA 12/25/2020, 1:52 PM  [place]. [place], [place], [address] Phone: [phone]   Fax:  [phone]  Name: Elijah [name] MRN: [id number] Date of Birth: 10-14-1995

## 2020-12-27 ENCOUNTER — Encounter: Payer: Self-pay | Admitting: *Deleted

## 2020-12-27 ENCOUNTER — Other Ambulatory Visit: Payer: Self-pay

## 2020-12-27 ENCOUNTER — Encounter: Payer: Self-pay | Admitting: Physical Medicine & Rehabilitation

## 2020-12-27 ENCOUNTER — Encounter
Payer: BC Managed Care – PPO | Attending: Physical Medicine & Rehabilitation | Admitting: Physical Medicine & Rehabilitation

## 2020-12-27 VITALS — BP 124/81 | HR 75 | Temp 98.6°F | Ht 70.0 in | Wt 190.0 lb

## 2020-12-27 DIAGNOSIS — H8112 Benign paroxysmal vertigo, left ear: Secondary | ICD-10-CM | POA: Insufficient documentation

## 2020-12-27 DIAGNOSIS — S06303S Unspecified focal traumatic brain injury with loss of consciousness of 1 hour to 5 hours 59 minutes, sequela: Secondary | ICD-10-CM | POA: Insufficient documentation

## 2020-12-27 MED ORDER — AMPHETAMINE-DEXTROAMPHETAMINE 5 MG PO TABS: 10.0000 mg | ORAL_TABLET | Freq: Two times a day (BID) | ORAL | 0 refills | Status: DC

## 2020-12-27 NOTE — Progress Notes (Signed)
Subjective:    Patient ID: Elijah [name], male    DOB: 1995/09/02, 25 y.o.   MRN: [id number]  HPI This is a follow-up visit for Mr. 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 He has been over at [facility] and made gains with balance and with his vestibular sx. he still feels a bit swimmy headed at times but it is improving.  He has not fallen.  He has no problems driving in a car at this point.  He's sleeping fairly well, typically 6-8 hours per night.   The adderall has helped with his energy and focus but he feels the dose might need to be higher as concentration can be an issue.  He really focuses on 1 task at a time he is generally able to complete things.  He struggles more when distracted and in crowds..   He has followed up with ENT and there wasn't a big change in his hearing. They suspect ear drum, inner ear damage  Mood has been positive.  He is upbeat about his future.  Family is very supportive.  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  He would like to return to work there.   Pain Inventory Average Pain 1 Pain Right Now 1 My pain is intermittent  LOCATION OF PAIN  shoulder  BOWEL Number of stools per week: 14 Oral laxative use No  Type of laxative none Enema or suppository use No  History of colostomy No  Incontinent No   BLADDER Normal In and out cath, frequency n/a Able to self cath n/a Bladder incontinence No  Frequent urination No  Leakage with coughing No  Difficulty starting stream No  Incomplete bladder emptying No    Mobility walk without assistance  Function employed # of hrs/week 40 Do you have any goals in this area?  no  Neuro/Psych dizziness  Prior Studies Any changes since last visit?  no  Physicians involved in your care Any changes since last visit?  no   Family History  Problem Relation Age of Onset  . Psoriasis Sister   . Heart disease Maternal  Grandfather    Social History   Socioeconomic History  . Marital status: Single    Spouse name: Not on file  . Number of children: Not on file  . Years of education: Not on file  . Highest education level: Not on file  Occupational History  . Not on file  Tobacco Use  . Smoking status: Current Every Day Smoker    Packs/day: 1.00    Years: 5.00    Pack years: 5.00    Types: Cigarettes  . Smokeless tobacco: Current User    Types: Chew  Substance and Sexual Activity  . Alcohol use: No  . Drug use: No  . Sexual activity: Yes  Other Topics Concern  . Not on file  Social History Narrative  . Not on file   Social Determinants of Health   Financial Resource Strain: Not on file  Food Insecurity: Not on file  Transportation Needs: Not on file  Physical Activity: Not on file  Stress: Not on file  Social Connections: Not on file   History reviewed. No pertinent surgical history. Past Medical History:  Diagnosis Date  . ADD (attention deficit disorder)    Temp 98.6 F (37 C)   Ht 5\' 10"  (1.778 m)   Wt 190 lb (86.2 kg)  BMI 27.26 kg/m   Opioid Risk Score:   Fall Risk Score:  `1  Depression screen PHQ 2/9  Depression screen [facility] 2/9 10/25/2020 05/16/2015  Decreased Interest 1 0  Down, Depressed, Hopeless 0 0  PHQ - 2 Score 1 0  Altered sleeping 2 -  Tired, decreased energy 0 -  Change in appetite 0 -  Feeling bad or failure about yourself  0 -  Trouble concentrating 0 -  Moving slowly or fidgety/restless 1 -  Suicidal thoughts 0 -  PHQ-9 Score 4 -  Difficult doing work/chores Somewhat difficult -    Review of Systems  Constitutional: Negative.   HENT: Negative.   Eyes: Negative.   Respiratory: Negative.   Cardiovascular: Negative.   Gastrointestinal: Negative.   Endocrine: Negative.   Genitourinary: Negative.   Musculoskeletal: Positive for arthralgias.  Skin: Negative.   Allergic/Immunologic: Negative.   Neurological: Positive for dizziness.   Hematological: Negative.   Psychiatric/Behavioral: Negative.   All other systems reviewed and are negative.      Objective:   Physical Exam General: No acute distress HEENT: EOMI, oral membranes moist Cards: reg rate  Chest: normal effort Abdomen: Soft, NT, ND Skin: dry, intact Extremities: no edema Psych: pleasant and appropriate  Neuro: Alert and oriented x 3.  Reasonable insight and awareness.  He has functional memory.  Sometimes struggles with concentration and processing but is more than functional.  Had 1-2 beats of nystagmus with left greater than right lateral gaze.  He is unable to hear from his left ear.  He is able to functionally hear from the right ear from either side.  [name] is 5 out of 5.  Gait was steady.  Heel-to-toe gait was still a bit off as patient occasionally would fall to the left.  Romberg testing was essentially negative.  Patient was able to stand on either leg without loss of balance.  Sensory exams intact. Musculoskeletal: Functional range of motion and posture           Assessment & Plan:  1. 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  Patient with ongoing cognitive deficits with impaired focus and concentration.  Patient also with impaired balance and vestibular symptoms. 2.  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  History of ADHD     Plan: 1.  wrap up outpt therapies 2.  continue vestibular HEP 3.  Increase Adderall to 10 mg daily at breakfast and lunch.   4.  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 Hearing aid for left ear? 5.  Continue to optimize sleep  6. Acclimation exercises in heat.  7. Can resume work on July 11 with part time hours, 4 hours per day. Then he can slowly increase as tolerated.  Discussed the importance of pacing himself.  If he begins to feel dizzy or lightheaded he needs to back off what he is doing and find a cool, shady place.  I am a little bit hesitant about him returning to work in July but he tells me he can  work on [employer] duty where he is not having to lift or pull heavy objects.  He will not be operating in any heights either.  25 minutes of direct patient time was spent today in the office counseling the patient and family as well as coming up with a custom treatment plan.  I will see the patient back in about 3 months.

## 2020-12-27 NOTE — Patient Instructions (Signed)
You may drive.  I'll let you return to work the week of 01/29/21. Start with 4 hours per day and increase your hours as you tolerate things.   If you feel weak or dizzy, you need to find a cool, shady place.   Avoid heavy lifting.   Drink plenty of water.

## 2020-12-28 ENCOUNTER — Telehealth: Payer: Self-pay

## 2020-12-28 DIAGNOSIS — S06303S Unspecified focal traumatic brain injury with loss of consciousness of 1 hour to 5 hours 59 minutes, sequela: Secondary | ICD-10-CM

## 2020-12-28 MED ORDER — AMPHETAMINE-DEXTROAMPHETAMINE 10 MG PO TABS: 10.0000 mg | ORAL_TABLET | Freq: Every day | ORAL | 0 refills | Status: DC

## 2020-12-28 MED ORDER — AMPHETAMINE-DEXTROAMPHETAMINE 10 MG PO TABS: 10.0000 mg | ORAL_TABLET | Freq: Two times a day (BID) | ORAL | 0 refills | Status: DC

## 2020-12-28 NOTE — Telephone Encounter (Signed)
It was a mistake. New rx sent in

## 2020-12-28 NOTE — Telephone Encounter (Signed)
Pharmacist called: Is it possible to increase the quantity of Rx Adderall. As it stands now its a only a 15 day supply.

## 2021-01-01 ENCOUNTER — Ambulatory Visit: Payer: BC Managed Care – PPO

## 2021-01-01 ENCOUNTER — Other Ambulatory Visit: Payer: Self-pay

## 2021-01-01 DIAGNOSIS — M6281 Muscle weakness (generalized): Secondary | ICD-10-CM | POA: Diagnosis not present

## 2021-01-01 DIAGNOSIS — M25512 Pain in left shoulder: Secondary | ICD-10-CM

## 2021-01-01 DIAGNOSIS — Z9181 History of falling: Secondary | ICD-10-CM

## 2021-01-01 DIAGNOSIS — R2681 Unsteadiness on feet: Secondary | ICD-10-CM

## 2021-01-01 DIAGNOSIS — R42 Dizziness and giddiness: Secondary | ICD-10-CM

## 2021-01-01 NOTE — Patient Instructions (Signed)
Access Code: [email or web address] URL: [email or web address] Date: 01/01/2021 Prepared by: Claude Manges  Exercises Standing Shoulder Horizontal Abduction with Resistance - 1 x daily - 7 x weekly - 3 sets - 10 reps Standing Shoulder Row with Anchored Resistance - 1 x daily - 7 x weekly - 3 sets - 10 reps Shoulder extension with resistance - Neutral - 1 x daily - 7 x weekly - 3 sets - 10 reps Standing March - 1 x daily - 7 x weekly - 3 sets - 10 reps Goblet Squat with Kettlebell - 1 x daily - 7 x weekly - 3 sets - 10 reps Standing Shoulder Single Arm PNF D2 Flexion with Resistance - 1 x daily - 7 x weekly - 2 sets - 10 reps (Red)

## 2021-01-01 NOTE — Therapy (Signed)
[address] [place], [place], [address] Phone: [phone]   Fax:  [phone]  Physical Therapy Treatment  Patient Details  Name: Elijah [name] MRN: [id number] Date of Birth: 1996-02-07 Referring Provider (PT): Meredith Staggers, MD   Encounter Date: 01/01/2021   PT End of Session - 01/01/21 1605     Visit Number 7    Date for PT Re-Evaluation 01/03/21    Authorization Type BCBS: 30 visits PT/OT combined    PT Start Time 1600    PT Stop Time 1640    PT Time Calculation (min) 40 min    Activity Tolerance Patient tolerated treatment well    Behavior During Therapy [facility] for tasks assessed/performed             Past Medical History:  Diagnosis Date   ADD (attention deficit disorder)     History reviewed. No pertinent surgical history.  There were no vitals filed for this visit.   Subjective Assessment - 01/01/21 1606     Subjective Saw Dr Naaman Plummer, Cleared for back to work July 11th. Continue PT. Pt reports balance is more normal now but shoulder is still bothersome    Patient Stated Goals to feel more steady on his feet    Currently in Pain? No/denies                [facility] PT Assessment - 01/01/21 0001       Assessment   Medical Diagnosis Focal TBI with LOC, Vertigo    Referring Provider (PT) Meredith Staggers, MD    Onset Date/Surgical Date 09/10/20      Dynamic Gait Index   Level Surface Normal    Change in Gait Speed Normal    Gait with Horizontal Head Turns Normal    Gait with Vertical Head Turns Normal    Gait and Pivot Turn Normal    Step Over Obstacle Normal    Step Around Obstacles Normal    Steps Normal    Total Score 24                           [facility] Adult PT Treatment/Exercise - 01/01/21 0001       High Level Balance   High Level Balance Comments Airex STS with yellow med ball OHP 10 x 2      Lumbar Exercises: Aerobic   Nustep L6 x 6 min       Lumbar Exercises: Machines for Strengthening   Leg Press 60lb 2x15    Other Lumbar Machine Exercise Rows & Lats 35lb 2x15      Lumbar Exercises: Standing   Row Strengthening;Both;15 reps   2 sets, green   Shoulder Extension Strengthening;Both;15 reps   2 sets, green   Other Standing Lumbar Exercises Shoulder ER 15 x 2 green TB.                    PT Education - 01/01/21 1643     Education Details Updated HEP with emphasis on shoulder stab/strength. Access Code: [email or web address]    Person(s) Educated Patient    Methods Explanation;Handout;Demonstration    Comprehension Verbalized understanding;Returned demonstration              PT Short Term Goals - 01/01/21 1633       PT SHORT TERM GOAL #1   Title Independent with initial HEP    Status Partially Met  PT SHORT TERM GOAL #2   Title Pt will demonstrate symmetrical UE strength with functional activities with minimal c/o fatigue to facilitate functional arm use for returning to work    Status On-going               PT Long Term Goals - 01/01/21 1616       PT LONG TERM GOAL #1   Title FOTO to at least 90%    Time 8    Period Weeks    Status Achieved   93%     PT LONG TERM GOAL #2   Title pain free end range shoulder ROM with ADLs and lifting activities    Time 8    Period Weeks    Status On-going      PT LONG TERM GOAL #3   Title Pt will report no dizziness or unsteadiness on feet with prolonged walking in busy environments    Time 8    Period Weeks    Status Achieved      PT LONG TERM GOAL #4   Title Pt will score >/=23/24 on the DGI to demonstrate improved functional higher level balance.    Time 8    Period Weeks    Status Achieved      PT LONG TERM GOAL #5   Title Pt will be able to maintain balance for 30 seconds with minial to no sway for all conditions of the MCTSIB to demonstrate improved balance with varied vestibular, visual and somatosensory input.    Time 8    Period Weeks     Status Achieved                   Plan - 01/01/21 1605     Clinical Impression Statement Pt tolerated all interventions well today. He has met balance/dizziness goals however, Reassessed shoulder ROM and grossly symmetrical with some continued discomfort end range flex/abd on the left. Pt reports he is only still concerned that left arm isnt the same as right, fatigues quicker.Updated HEP provided today with some further shoudler strengthening exercises, plan to reassess in 2 weeks.    Examination-Participation Restrictions Occupation;Driving    Stability/Clinical Decision Making Evolving/Moderate complexity    Rehab Potential Good    PT Frequency 1x / week    PT Duration 8 weeks    PT Treatment/Interventions ADLs/Self Care Home Management;Cryotherapy;Electrical Stimulation;Moist Heat;Canalith Repostioning;Neuromuscular re-education;Balance training;Therapeutic exercise;Therapeutic activities;Functional mobility training;Patient/family education;Manual techniques;Vestibular;Taping    PT Next Visit Plan to come in for at least 1 more PT visit in 2 weeks  with emphasis on UE strength to facilitate shoudler stab/strength gains needed for RTW. 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 [place], [place], [address] Phone: [phone]   Fax:  [phone]  Name: Elijah [name] MRN: [id number] Date of Birth: Sep 02, 1995

## 2021-01-15 ENCOUNTER — Ambulatory Visit: Payer: BC Managed Care – PPO

## 2021-01-15 ENCOUNTER — Other Ambulatory Visit: Payer: Self-pay

## 2021-01-15 DIAGNOSIS — R41841 Cognitive communication deficit: Secondary | ICD-10-CM

## 2021-01-15 DIAGNOSIS — M25512 Pain in left shoulder: Secondary | ICD-10-CM

## 2021-01-15 DIAGNOSIS — M6281 Muscle weakness (generalized): Secondary | ICD-10-CM

## 2021-01-15 DIAGNOSIS — R2681 Unsteadiness on feet: Secondary | ICD-10-CM

## 2021-01-15 DIAGNOSIS — R42 Dizziness and giddiness: Secondary | ICD-10-CM

## 2021-01-15 DIAGNOSIS — Z9181 History of falling: Secondary | ICD-10-CM

## 2021-01-15 NOTE — Therapy (Signed)
[address]. [place], [place], [address] Phone: [phone]   Fax:  [phone]  Physical Therapy Treatment/Discharge Summary  Patient Details  Name: Elijah [name] MRN: [id number] Date of Birth: 01-Feb-1996 Referring Provider (PT): Meredith Staggers, MD   Encounter Date: 01/15/2021   PT End of Session - 01/15/21 1520     Visit Number 8    Date for PT Re-Evaluation 01/03/21    Authorization Type BCBS: 30 visits PT/OT combined    PT Start Time 8250    PT Stop Time 1556    PT Time Calculation (min) 41 min    Activity Tolerance Patient tolerated treatment well    Behavior During Therapy [facility] for tasks assessed/performed             Past Medical History:  Diagnosis Date   ADD (attention deficit disorder)     History reviewed. No pertinent surgical history.  There were no vitals filed for this visit.   Subjective Assessment - 01/15/21 1519     Subjective doing well, no new complaints. Has been doing arm exercises does get some pain/discomfort 2/10 when trying a pushup at home.    Patient Stated Goals to feel more steady on his feet    Currently in Pain? No/denies                               [facility] Adult PT Treatment/Exercise - 01/15/21 0001       Lumbar Exercises: Aerobic   Elliptical L3.5 x 3 min each      Lumbar Exercises: Machines for Strengthening   Leg Press 60lb 2x15    Other Lumbar Machine Exercise Rows & Lats 35lb 2x15      Lumbar Exercises: Standing   Row Strengthening;Both;15 reps   2 sets, green   Shoulder Extension Strengthening;Both;15 reps   2 sets, green   Other Standing Lumbar Exercises Shoulder ER 15 x 2 green TB. Standign PNF D2 flexion  with band x 15      Shoulder Exercises: Standing   Other Standing Exercises wall pushup plus x 10. lower trap setting at wall x10 3" each.            prone Y's x 10 within comfortable  range        PT Education  - 01/15/21 1555     Education Details Updated HEP for further emphasis on shoulder stab/strength. Access code [facility]    Person(s) Educated Patient    Methods Explanation;Demonstration;Handout    Comprehension Verbalized understanding;Returned demonstration              PT Short Term Goals - 01/15/21 1533       PT SHORT TERM GOAL #1   Title Independent with initial HEP    Status Achieved      PT SHORT TERM GOAL #2   Title Pt will demonstrate symmetrical UE strength with functional activities with minimal c/o fatigue to facilitate functional arm use for returning to work    Status Achieved   strengthgrossly  symmetrical              PT Long Term Goals - 01/15/21 1557       PT LONG TERM GOAL #1   Title FOTO to at least 90%    Time 8    Period Weeks    Status Achieved   93%  PT LONG TERM GOAL #2   Title pain free end range shoulder ROM with ADLs and lifting activities    Time 8    Period Weeks    Status Partially Met   grossly pain free, some end range discomfort     PT LONG TERM GOAL #3   Title Pt will report no dizziness or unsteadiness on feet with prolonged walking in busy environments    Time 8    Period Weeks    Status Achieved      PT LONG TERM GOAL #4   Title Pt will score >/=23/24 on the DGI to demonstrate improved functional higher level balance.    Time 8    Period Weeks    Status Achieved      PT LONG TERM GOAL #5   Title Pt will be able to maintain balance for 30 seconds with minial to no sway for all conditions of the MCTSIB to demonstrate improved balance with varied vestibular, visual and somatosensory input.    Time 8    Period Weeks    Status Achieved                   Plan - 01/15/21 1533     Clinical Impression Statement Pt is doing excellently. He has met all PT goals at this time. Shoulder ROM and strength grossly symetrical at this time other than low level discomfort pt reports at end range. Pt reports he is happy  with current progress and functional status. Today's session with emphasis of reviewing discharge HEP for strength and scapular stabilization to support UE function.    Examination-Participation Restrictions Occupation;Driving    Stability/Clinical Decision Making Evolving/Moderate complexity    Rehab Potential Good    PT Treatment/Interventions ADLs/Self Care Home Management;Cryotherapy;Electrical Stimulation;Moist Heat;Canalith Repostioning;Neuromuscular re-education;Balance training;Therapeutic exercise;Therapeutic activities;Functional mobility training;Patient/family education;Manual techniques;Vestibular;Taping    PT Next Visit Plan discharge to HEP at this time.    Recommended Other Services Educated pt that if any future issues arise he may contact his MD regarding another PT referral if needed in the future.    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  Patient goals were met. Patient is being discharged due to meeting the stated rehab goals and being happy with current functional status.         Hall Busing, PT, DPT 01/15/2021, 3:58 PM  [address] [place], [place], [address] Phone: [phone]   Fax:  [phone]  Name: Elijah [name] MRN: [id number] Date of Birth: March 10, 1996

## 2021-01-29 ENCOUNTER — Telehealth: Payer: Self-pay

## 2021-01-29 MED ORDER — AMPHETAMINE-DEXTROAMPHETAMINE 10 MG PO TABS: 10.0000 mg | ORAL_TABLET | Freq: Two times a day (BID) | ORAL | 0 refills | Status: DC

## 2021-01-29 NOTE — Telephone Encounter (Signed)
RX sent in.  thx

## 2021-01-29 NOTE — Telephone Encounter (Signed)
Thats Adderall 10 mg

## 2021-01-29 NOTE — Telephone Encounter (Signed)
Pt is requesting Adderall medication refill , per pmp last filled 12/28/2020 he had an appointment on 6/8/20222 upcoming appt 03/28/2021 . Thank you .

## 2021-02-28 ENCOUNTER — Telehealth: Payer: Self-pay | Admitting: *Deleted

## 2021-02-28 MED ORDER — AMPHETAMINE-DEXTROAMPHETAMINE 10 MG PO TABS: 10.0000 mg | ORAL_TABLET | Freq: Two times a day (BID) | ORAL | 0 refills | Status: DC

## 2021-02-28 NOTE — Telephone Encounter (Signed)
PMP was Reviewed Dr Riley Kill note was reviewed Adderall e- scribed today.  Placed a call to Mr. Lange, unable to leave a message regarding the above

## 2021-02-28 NOTE — Telephone Encounter (Signed)
Patient left a message for Dr. Riley Kill asking for a refill on his Adderall 10 mg. He states that he will run out tomorrow.  Dr. Riley Kill is on vacation. Forward to Jacalyn Lefevre, ANP for review

## 2021-03-28 ENCOUNTER — Encounter
Payer: BC Managed Care – PPO | Attending: Physical Medicine & Rehabilitation | Admitting: Physical Medicine & Rehabilitation

## 2021-03-28 DIAGNOSIS — S06303S Unspecified focal traumatic brain injury with loss of consciousness of 1 hour to 5 hours 59 minutes, sequela: Secondary | ICD-10-CM | POA: Insufficient documentation

## 2021-03-28 DIAGNOSIS — F068 Other specified mental disorders due to known physiological condition: Secondary | ICD-10-CM | POA: Insufficient documentation

## 2021-03-28 DIAGNOSIS — S069X0S Unspecified intracranial injury without loss of consciousness, sequela: Secondary | ICD-10-CM | POA: Insufficient documentation

## 2021-04-02 ENCOUNTER — Telehealth: Payer: Self-pay

## 2021-04-02 MED ORDER — AMPHETAMINE-DEXTROAMPHETAMINE 10 MG PO TABS: 10.0000 mg | ORAL_TABLET | Freq: Two times a day (BID) | ORAL | 0 refills | Status: DC

## 2021-04-02 NOTE — Telephone Encounter (Signed)
PMP was reviewed .  Adderall was e-scribed today. Placed a call to Mr. Dooling no answer, placed a call to Ms. Huckaba (mother) regarding his medication and his appointment with Dr Riley Kill on Wednesday 04/04/21, she verbalizes understanding.

## 2021-04-02 NOTE — Telephone Encounter (Signed)
Patient called requesting refill on Adderall 10 mg. Patient is aware that he missed appt last week. I asked admin to call him to reschedule. Can you address the refill in Dr. Riley Kill absence?

## 2021-04-04 ENCOUNTER — Encounter (HOSPITAL_BASED_OUTPATIENT_CLINIC_OR_DEPARTMENT_OTHER): Payer: BC Managed Care – PPO | Admitting: Physical Medicine & Rehabilitation

## 2021-04-04 ENCOUNTER — Other Ambulatory Visit: Payer: Self-pay

## 2021-04-04 ENCOUNTER — Encounter: Payer: Self-pay | Admitting: Physical Medicine & Rehabilitation

## 2021-04-04 VITALS — BP 122/75 | HR 70 | Temp 98.6°F | Ht 70.0 in | Wt 182.0 lb

## 2021-04-04 DIAGNOSIS — S06303S Unspecified focal traumatic brain injury with loss of consciousness of 1 hour to 5 hours 59 minutes, sequela: Secondary | ICD-10-CM | POA: Diagnosis not present

## 2021-04-04 DIAGNOSIS — F068 Other specified mental disorders due to known physiological condition: Secondary | ICD-10-CM | POA: Insufficient documentation

## 2021-04-04 DIAGNOSIS — S069X0S Unspecified intracranial injury without loss of consciousness, sequela: Secondary | ICD-10-CM | POA: Diagnosis not present

## 2021-04-04 MED ORDER — AMPHETAMINE-DEXTROAMPHET ER 30 MG PO CP24: 30.0000 mg | ORAL_CAPSULE | Freq: Every day | ORAL | 0 refills | Status: DC

## 2021-04-04 NOTE — Patient Instructions (Signed)
PLEASE FEEL FREE TO CALL OUR OFFICE WITH ANY PROBLEMS OR QUESTIONS ([phone])      

## 2021-04-04 NOTE — Progress Notes (Signed)
Subjective:    Patient ID: Elijah [name], male    DOB: 1995/08/13, 25 y.o.   MRN: [id number]  HPI Elijah [name] is here in follow up of his TBI. He is back working full time and "killing it". He is doing better than before which he attributes to stopping  etoh. He has more energy. His shoulder is without pain for the most part except for extreme angles.  The heat and sunlight hasn't bothered him.   He is using adderall and feels that helps but has noticed that as he is managing more things, the dose isn't quite enough to maintain his concentration and focus.   He still doesn't have much hearing out of his left ear. It doesn't bother him much, though and he doesn't want a hearing aid  Pain Inventory Average Pain 0 Pain Right Now 0 My pain is  No pain , not able to hear out of left ear  LOCATION OF PAIN  No pain  BOWEL Number of stools per week: 14 BLADDER Normal   walk without assistance ability to climb steps?  yes do you drive?  yes  Function employed # of hrs/week [employer]" 40 plus hours per week  Neuro/Psych No problems in this area  Prior Studies Any changes since last visit?  no  Physicians involved in your care Any changes since last visit?  no   Family History  Problem Relation Age of Onset   Psoriasis Sister    Heart disease Maternal Grandfather    Social History   Socioeconomic History   Marital status: Single    Spouse name: Not on file   Number of children: Not on file   Years of education: Not on file   Highest education level: Not on file  Occupational History   Not on file  Tobacco Use   Smoking status: Every Day    Packs/day: 1.00    Years: 5.00    Pack years: 5.00    Types: Cigarettes   Smokeless tobacco: Current    Types: Chew  Vaping Use   Vaping Use: Never used  Substance and Sexual Activity   Alcohol use: No   Drug use: No   Sexual activity: Yes  Other Topics Concern   Not on file  Social History Narrative    Not on file   Social Determinants of Health   Financial Resource Strain: Not on file  Food Insecurity: Not on file  Transportation Needs: Not on file  Physical Activity: Not on file  Stress: Not on file  Social Connections: Not on file   History reviewed. No pertinent surgical history. Past Medical History:  Diagnosis Date   ADD (attention deficit disorder)    BP 122/75   Pulse 70   Temp 98.6 F (37 C)   Ht 5\' 10"  (1.778 m)   Wt 182 lb (82.6 kg)   SpO2 97%   BMI 26.11 kg/m   Opioid Risk Score:   Fall Risk Score:  `1  Depression screen PHQ 2/9  Depression screen [facility] 2/9 10/25/2020 05/16/2015  Decreased Interest 1 0  Down, Depressed, Hopeless 0 0  PHQ - 2 Score 1 0  Altered sleeping 2 -  Tired, decreased energy 0 -  Change in appetite 0 -  Feeling bad or failure about yourself  0 -  Trouble concentrating 0 -  Moving slowly or fidgety/restless 1 -  Suicidal thoughts 0 -  PHQ-9 Score 4 -  Difficult doing work/chores  Somewhat difficult -    Review of Systems  HENT:  Positive for hearing loss.        Left ear  All other systems reviewed and are negative.     Objective:   Physical Exam  General: No acute distress HEENT: NCAT, EOMI, oral membranes moist Cards: reg rate  Chest: normal effort Abdomen: Soft, NT, ND Skin: dry, intact Extremities: no edema Psych: pleasant and appropriate    Neuro: Alert and oriented x 3. Normal insight and awareness. Intact Memory. Normal language. Concentration improve.  Musculoskeletal: Functional range of motion and posture           Assessment & Plan:  1. 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  Patient with ongoing cognitive deficits with impaired focus and concentration.  Patient also with impaired balance and vestibular symptoms. 2.  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  History of ADHD     Plan: 1.  advised him to be sensible about work 2.  continue vestibular HEP 3.  adjust adderall to 30mg  xr daily.   4.  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 Hearing aid for left ear? 5.  Continue to optimize sleep  6. Acclimation exercises in heat.  7. resumed work without issue   15 minutes of direct patient time was spent today in the office counseling the patient and family as well as coming up with a custom treatment plan.  I will see the patient back in about 3 months.

## 2021-05-24 ENCOUNTER — Other Ambulatory Visit: Payer: Self-pay | Admitting: Physical Medicine & Rehabilitation

## 2021-05-24 ENCOUNTER — Telehealth: Payer: Self-pay

## 2021-05-24 NOTE — Telephone Encounter (Signed)
[facility] sent a request for Adderall.

## 2021-05-24 NOTE — Telephone Encounter (Signed)
Patient is calling for a refill on Adderall. Last fill date was 04/04/21.

## 2021-05-25 NOTE — Telephone Encounter (Signed)
Patient notified

## 2021-07-04 ENCOUNTER — Encounter: Payer: BC Managed Care – PPO | Admitting: Physical Medicine & Rehabilitation

## 2021-07-05 ENCOUNTER — Telehealth: Payer: Self-pay

## 2021-07-05 NOTE — Telephone Encounter (Signed)
MEDICATION:   Adderall XR 30 MG   LAST APPOINTMENT DATE: @11 /09/2020  NEXT APPOINTMENT DATE:@3 /02/2022  PMP Report:   Filled  Drug  QTY  Days  Prescriber  Dispenser  PMP   05/25/2021  Dextroamp-Amphet Er 30 Mg Cap 30.00 30 Za [address] ([id number]) [place]

## 2021-07-06 MED ORDER — AMPHETAMINE-DEXTROAMPHET ER 30 MG PO CP24: 30.0000 mg | ORAL_CAPSULE | Freq: Every day | ORAL | 0 refills | Status: DC

## 2021-07-06 NOTE — Telephone Encounter (Signed)
Appointment was scheduled for some time. This will not be acceptable moving forward

## 2021-07-06 NOTE — Telephone Encounter (Signed)
Patient cancelled appointment on Wednesday 07/04/21 because he had just gotten off work and was not going to make it to his appointment on time. He is rescheduled for 09/26/21.

## 2021-08-07 ENCOUNTER — Telehealth: Payer: Self-pay | Admitting: *Deleted

## 2021-08-07 MED ORDER — AMPHETAMINE-DEXTROAMPHET ER 30 MG PO CP24: 30.0000 mg | ORAL_CAPSULE | Freq: Every day | ORAL | 0 refills | Status: DC

## 2021-08-07 NOTE — Telephone Encounter (Signed)
Filled for January and feb

## 2021-08-07 NOTE — Telephone Encounter (Signed)
Mr Mangrum called for  a refill on his adderall. Last fill date was 07/06/21.

## 2021-08-07 NOTE — Telephone Encounter (Signed)
Notified. 

## 2021-09-26 ENCOUNTER — Encounter: Payer: Self-pay | Admitting: Physical Medicine & Rehabilitation

## 2021-09-26 ENCOUNTER — Other Ambulatory Visit: Payer: Self-pay

## 2021-09-26 ENCOUNTER — Encounter
Payer: BC Managed Care – PPO | Attending: Physical Medicine & Rehabilitation | Admitting: Physical Medicine & Rehabilitation

## 2021-09-26 VITALS — BP 117/74 | HR 58 | Ht 70.0 in | Wt 178.0 lb

## 2021-09-26 DIAGNOSIS — S069X0S Unspecified intracranial injury without loss of consciousness, sequela: Secondary | ICD-10-CM | POA: Insufficient documentation

## 2021-09-26 DIAGNOSIS — F068 Other specified mental disorders due to known physiological condition: Secondary | ICD-10-CM

## 2021-09-26 MED ORDER — AMPHETAMINE-DEXTROAMPHET ER 30 MG PO CP24: 30.0000 mg | ORAL_CAPSULE | Freq: Every day | ORAL | 0 refills | Status: DC

## 2021-09-26 NOTE — Patient Instructions (Signed)
PLEASE FEEL FREE TO CALL OUR OFFICE WITH ANY PROBLEMS OR QUESTIONS ([phone])      

## 2021-09-26 NOTE — Progress Notes (Signed)
? ?Subjective:  ? ? Patient ID: Elijah [name], male    DOB: September 15, 1995, 26 y.o.   MRN: [id number] ? ?HPI ?Pt is here in follow up of his TBI. He has been doing well. His balance is excellent an he's been working full time. His shoulder is without pain and with normal rom. He sleeps well. He uses adderall 30mg  daily for his attention which benefits him quite a bit. He usually takes it around 0500 daily.  ? ?He hasn't noticed any improvement in the hearing in his left ear.  ? ?Mood has been up beat. He is getting along well with other.  ? ? ? ?Pain Inventory ?Average Pain 0 ?Pain Right Now 0 ?My pain is  no pain ? ?In the last 24 hours, has pain interfered with the following? ?General activity 0 ?Relation with others 0 ?Enjoyment of life 0 ?What TIME of day is your pain at its worst? night ?Sleep (in general) na ? ?Pain is worse with:  no pain ?Pain improves with:  no pain ?Relief from Meds:  no pain ? ?Family History  ?Problem Relation Age of Onset  ? Psoriasis Sister   ? Heart disease Maternal Grandfather   ? ?Social History  ? ?Socioeconomic History  ? Marital status: Single  ?  Spouse name: Not on file  ? Number of children: Not on file  ? Years of education: Not on file  ? Highest education level: Not on file  ?Occupational History  ? Not on file  ?Tobacco Use  ? Smoking status: Every Day  ?  Packs/day: 1.00  ?  Years: 5.00  ?  Pack years: 5.00  ?  Types: Cigarettes  ? Smokeless tobacco: Current  ?  Types: Chew  ?Vaping Use  ? Vaping Use: Never used  ?Substance and Sexual Activity  ? Alcohol use: No  ? Drug use: No  ? Sexual activity: Yes  ?Other Topics Concern  ? Not on file  ?Social History Narrative  ? Not on file  ? ?Social Determinants of Health  ? ?Financial Resource Strain: Not on file  ?Food Insecurity: Not on file  ?Transportation Needs: Not on file  ?Physical Activity: Not on file  ?Stress: Not on file  ?Social Connections: Not on file  ? ?No past surgical history on file. ?No past surgical history on  file. ?Past Medical History:  ?Diagnosis Date  ? ADD (attention deficit disorder)   ? ?BP 117/74   Pulse (!) 58   Ht 5\' 10"  (1.778 m)   Wt 178 lb (80.7 kg)   SpO2 98%   BMI 25.54 kg/m?  ? ?Opioid Risk Score:   ?Fall Risk Score:  `1 ? ?Depression screen PHQ 2/9 ? ?Depression screen [facility] 2/9 09/26/2021 04/04/2021 10/25/2020 05/16/2015  ?Decreased Interest 0 0 1 0  ?Down, Depressed, Hopeless 0 0 0 0  ?PHQ - 2 Score 0 0 1 0  ?Altered sleeping - - 2 -  ?Tired, decreased energy - - 0 -  ?Change in appetite - - 0 -  ?Feeling bad or failure about yourself  - - 0 -  ?Trouble concentrating - - 0 -  ?Moving slowly or fidgety/restless - - 1 -  ?Suicidal thoughts - - 0 -  ?PHQ-9 Score - - 4 -  ?Difficult doing work/chores - - Somewhat difficult -  ?  ? ?Review of Systems  ?All other systems reviewed and are negative. ? ?   ?Objective:  ? Physical Exam ?General: No  acute distress ?HEENT: NCAT, EOMI, oral membranes moist ?Cards: reg rate  ?Chest: normal effort ?Abdomen: Soft, NT, ND ?Skin: dry, intact ?Extremities: no edema ?Psych: pleasant and appropriate   ? Neuro: Alert and oriented x 3. Normal insight and awareness. Intact Memory. Normal language and speech. Cranial nerve exam unremarkable  ?Musculoskeletal: good balance.  ?  ?  ?  ?  ?  ?Assessment & Plan:  ?1. 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  Patient with ongoing cognitive deficits with impaired focus and concentration.  Patient also with impaired balance and vestibular symptoms. ?2.  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  History of ADHD ?  ?  ?Plan: ?1.  he's doing a good job being safe at  work ?2.  continue vestibular HEP as needed ?3.  adjust adderall to 30mg  xr daily.   ?4.  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  Prognosis for future recovery is poor ?5.  Continue to optimize sleep --he's doing a good job ?  ?  ?15 minutes of direct patient time was spent today in the office counseling the patient and family as well as coming up with a custom treatment  plan.  We'll  see the patient back in about 4 months. ? ? ? ?  ?

## 2022-01-25 ENCOUNTER — Encounter: Payer: BC Managed Care – PPO | Attending: Registered Nurse | Admitting: Registered Nurse

## 2022-01-29 ENCOUNTER — Other Ambulatory Visit: Payer: Self-pay | Admitting: Physical Medicine & Rehabilitation

## 2022-01-29 ENCOUNTER — Telehealth: Payer: Self-pay | Admitting: Registered Nurse

## 2022-01-29 MED ORDER — AMPHETAMINE-DEXTROAMPHET ER 30 MG PO CP24: 30.0000 mg | ORAL_CAPSULE | Freq: Every day | ORAL | 0 refills | Status: DC

## 2022-01-29 NOTE — Telephone Encounter (Signed)
Received a refill request for Adderall. PMP was Reviewed Placed a call to Elijah [name], he states he has been out of his medication for awhile, he didn't call the office. We discussed Medication compliance.  He was instructed to call office a week prior to medication refill. He verbalizes understanding.  He stated he has noticed a difference with his inability to concentrate, Dr Riley Kill note was reviewed.  Adderall e-scribed today.He verbalizes understanding.

## 2022-02-22 ENCOUNTER — Encounter: Payer: Self-pay | Attending: Registered Nurse | Admitting: Registered Nurse

## 2022-02-22 ENCOUNTER — Encounter: Payer: Self-pay | Admitting: Registered Nurse

## 2022-02-22 VITALS — BP 112/71 | HR 67 | Ht 70.0 in | Wt 200.0 lb

## 2022-02-22 DIAGNOSIS — S069X0S Unspecified intracranial injury without loss of consciousness, sequela: Secondary | ICD-10-CM | POA: Insufficient documentation

## 2022-02-22 DIAGNOSIS — Z8659 Personal history of other mental and behavioral disorders: Secondary | ICD-10-CM | POA: Insufficient documentation

## 2022-02-22 DIAGNOSIS — Z5181 Encounter for therapeutic drug level monitoring: Secondary | ICD-10-CM | POA: Insufficient documentation

## 2022-02-22 DIAGNOSIS — F068 Other specified mental disorders due to known physiological condition: Secondary | ICD-10-CM | POA: Insufficient documentation

## 2022-02-22 MED ORDER — AMPHETAMINE-DEXTROAMPHET ER 20 MG PO CP24: 20.0000 mg | ORAL_CAPSULE | Freq: Every day | ORAL | 0 refills | Status: AC

## 2022-02-22 MED ORDER — AMPHETAMINE-DEXTROAMPHET ER 20 MG PO CP24: 20.0000 mg | ORAL_CAPSULE | Freq: Every day | ORAL | 0 refills | Status: DC

## 2022-02-22 NOTE — Progress Notes (Signed)
Subjective:    Patient ID: Elijah [name], male    DOB: [birth year], 26 y.o.   MRN: [id number]  HPI: Elijah [name] is a 26 y.o. male who returns for follow up appointment of his TBI and Cognitive Deficits. He states he is doing well and working full time.  He also reports he still have the hearing loss in his left ear.  He denies any pain at this time. He rates his pain 0. He states he is sleeping well His current exercise regime is walking.  PMP was reviewed. Last prescription was filled on 11/20/2021. Spoke with pharmacist at [facility], Adderall is on back order. They have Adderall 20 mg ER in stock, message sent to Dr Riley Kill regarding the above, awaiting response. Dr Riley Kill in agreement with Adderall 20 mg.  [name] is aware of the above and verbalizes understanding.    Pain Inventory Average Pain 0 Pain Right Now 0 My pain is sharp  In the last 24 hours, has pain interfered with the following? General activity 1 Relation with others 0 Enjoyment of life 0 What TIME of day is your pain at its worst? morning  Sleep (in general) Good  Pain is worse with:  NA Pain improves with: therapy/exercise Relief from Meds: 0  Family History  Problem Relation Age of Onset   Psoriasis Sister    Heart disease Maternal Grandfather    Social History   Socioeconomic History   Marital status: Single    Spouse name: Not on file   Number of children: Not on file   Years of education: Not on file   Highest education level: Not on file  Occupational History   Not on file  Tobacco Use   Smoking status: Every Day    Packs/day: 1.00    Years: 5.00    Total pack years: 5.00    Types: Cigarettes   Smokeless tobacco: Current    Types: Chew  Vaping Use   Vaping Use: Never used  Substance and Sexual Activity   Alcohol use: No   Drug use: No   Sexual activity: Yes  Other Topics Concern   Not on file  Social History Narrative   Not on file   Social Determinants of Health   Financial  Resource Strain: Not on file  Food Insecurity: Not on file  Transportation Needs: Not on file  Physical Activity: Not on file  Stress: Not on file  Social Connections: Not on file   History reviewed. No pertinent surgical history. History reviewed. No pertinent surgical history. Past Medical History:  Diagnosis Date   ADD (attention deficit disorder)    BP 112/71   Pulse 67   Ht 5\' 10"  (1.778 m)   Wt 200 lb (90.7 kg)   SpO2 97%   BMI 28.70 kg/m   Opioid Risk Score:   Fall Risk Score:  `1  Depression screen [facility] 2/9     02/22/2022    9:05 AM 09/26/2021    9:19 AM 04/04/2021    3:02 PM 10/25/2020    1:03 PM 05/16/2015    2:24 PM  Depression screen PHQ 2/9  Decreased Interest 0 0 0 1 0  Down, Depressed, Hopeless 0 0 0 0 0  PHQ - 2 Score 0 0 0 1 0  Altered sleeping    2   Tired, decreased energy    0   Change in appetite    0   Feeling bad or failure about yourself  0   Trouble concentrating    0   Moving slowly or fidgety/restless    1   Suicidal thoughts    0   PHQ-9 Score    4   Difficult doing work/chores    Somewhat difficult       Review of Systems  Musculoskeletal:  Positive for back pain.  All other systems reviewed and are negative.     Objective:   Physical Exam Vitals and nursing note reviewed.  Constitutional:      Appearance: Normal appearance.  Cardiovascular:     Rate and Rhythm: Normal rate and regular rhythm.     Pulses: Normal pulses.     Heart sounds: Normal heart sounds.  Pulmonary:     Effort: Pulmonary effort is normal.     Breath sounds: Normal breath sounds.  Musculoskeletal:     Cervical back: Normal range of motion and neck supple.     Comments: Normal Muscle Bulk and Muscle Testing Reveals:  Upper Extremities: Full ROM and Muscle Strength 5/5  Lower Extremities: Full ROM and Muscle Strength 5/5 Arises from Table with ease Narrow Based  Gait     Skin:    General: Skin is warm and dry.  Neurological:     Mental Status: He is  alert and oriented to person, place, and time.  Psychiatric:        Mood and Affect: Mood normal.        Behavior: Behavior normal.         Assessment & Plan:  TBI/ Post Fall/ Cognitive Deficits: Continue Adderall. Continue to Monitor. He's doing well, continue to monitor.  2. History ADHD: RX: Adderall ER 20 mg daily. Second script sent for the following. He was instructed to call the end of September for the remaining two prescriptions. He verbalizes understanding.   F/U with Dr Riley Kill in 4 months

## 2022-02-27 LAB — DRUG TOX MONITOR 1 W/CONF, ORAL FLD
Amphetamines: NEGATIVE ng/mL (ref <10)
Barbiturates: NEGATIVE ng/mL (ref <10)
Benzodiazepines: NEGATIVE ng/mL (ref <0.50)
Buprenorphine: NEGATIVE ng/mL (ref <0.10)
Cocaine: NEGATIVE ng/mL (ref <5.0)
Cotinine: 179.6 ng/mL — ABNORMAL HIGH (ref <5.0)
Fentanyl: NEGATIVE ng/mL (ref <0.10)
Heroin Metabolite: NEGATIVE ng/mL (ref <1.0)
MARIJUANA: POSITIVE ng/mL — AB (ref <2.5)
MDMA: NEGATIVE ng/mL (ref <10)
Meprobamate: NEGATIVE ng/mL (ref <2.5)
Methadone: NEGATIVE ng/mL (ref <5.0)
Nicotine Metabolite: POSITIVE ng/mL — AB (ref <5.0)
Opiates: NEGATIVE ng/mL (ref <2.5)
Phencyclidine: NEGATIVE ng/mL (ref <10)
THC: 3.6 ng/mL — ABNORMAL HIGH (ref <2.5)
Tapentadol: NEGATIVE ng/mL (ref <5.0)
Tramadol: NEGATIVE ng/mL (ref <5.0)
Zolpidem: NEGATIVE ng/mL (ref <5.0)

## 2022-02-27 LAB — DRUG TOX ALC METAB W/CON, ORAL FLD: Alcohol Metabolite: NEGATIVE ng/mL (ref <25)

## 2022-03-04 ENCOUNTER — Telehealth: Payer: Self-pay | Admitting: *Deleted

## 2022-03-04 NOTE — Telephone Encounter (Signed)
Drug screen done due to Adderall is a CII medication. He had not had any due to pharmacy being out of stock, so negative is expected. There is a small amount of THC present.

## 2022-10-26 IMAGING — CT CT TEMPORAL BONES W/O CM
3 of 8 series · 15 of 40 positions shown, 17 images · non-contrast
Comparison: None.

CLINICAL DATA: Temporal bone fracture

EXAM:
CT TEMPORAL BONES WITHOUT CONTRAST
TECHNIQUE: Axial and coronal plane CT imaging of the petrous temporal bones was
performed with thin-collimation image reconstruction. No intravenous
contrast was administered. Multiplanar CT image reconstructions were
also generated.

[Series 4: temporal bone 0.6 u75u · axial · 0.41mm/px · z∈[-152,-76]mm · 7 of 172 slices shown, 9 images]
[im 22/172  brain]
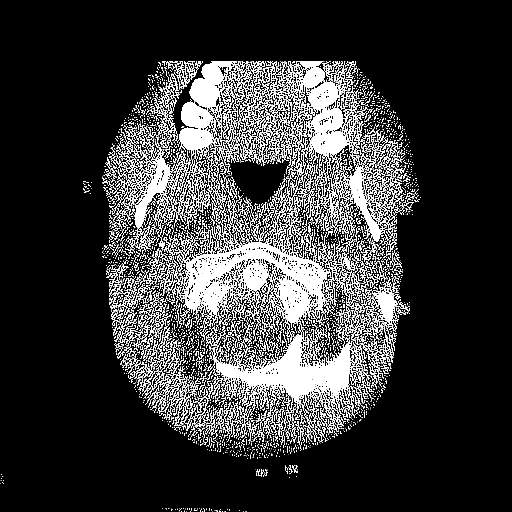
[im 22/172  bone]
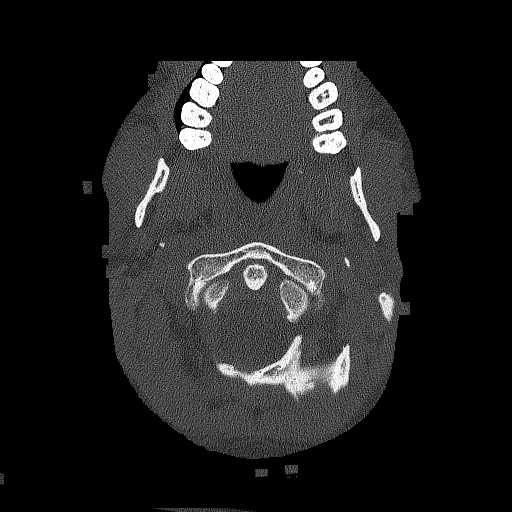
[im 43/172  bone]
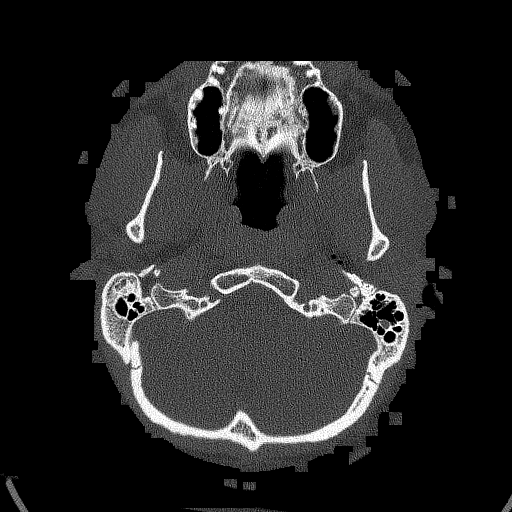
[im 65/172  bone]
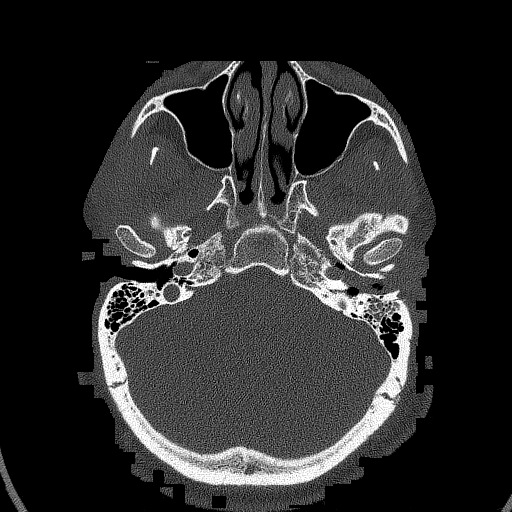
[im 86/172  bone]
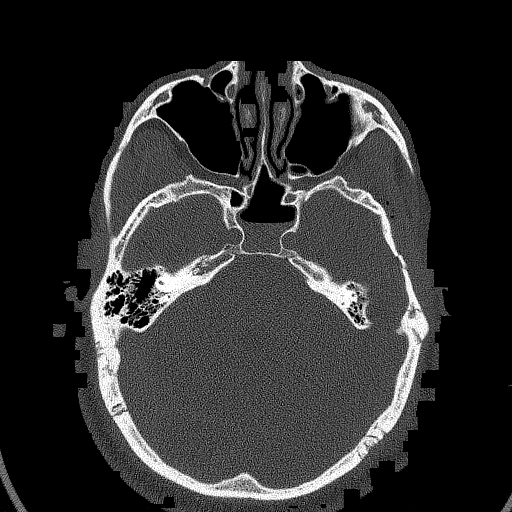
[im 107/172  brain]
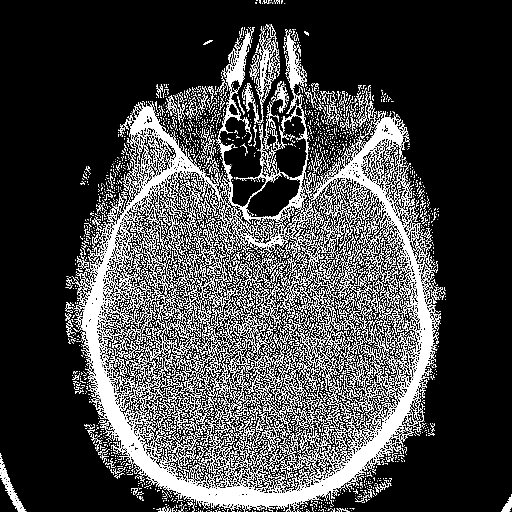
[im 107/172  bone]
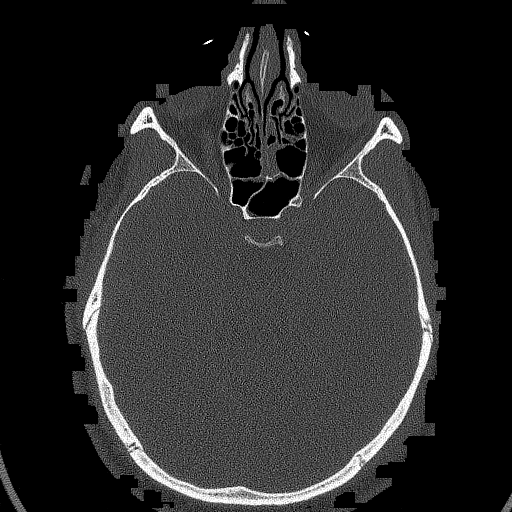
[im 129/172  bone]
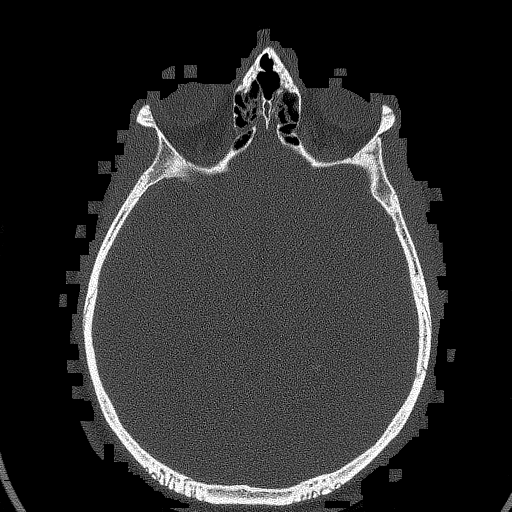
[im 150/172  bone]
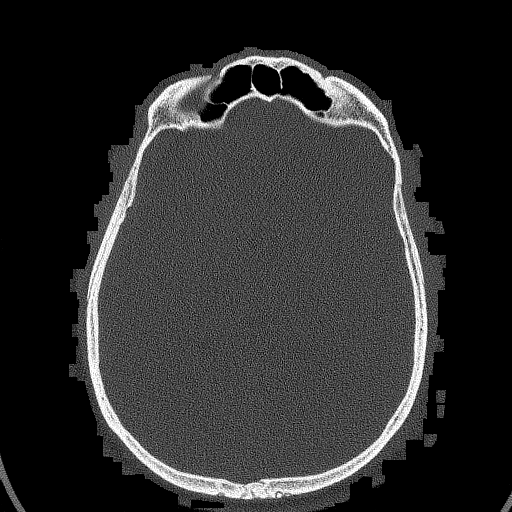

[Series 5: temporal bone left · axial · 0.20mm/px · z∈[-152,-75]mm · 7 of 172 slices shown]
[im 22/172  bone]
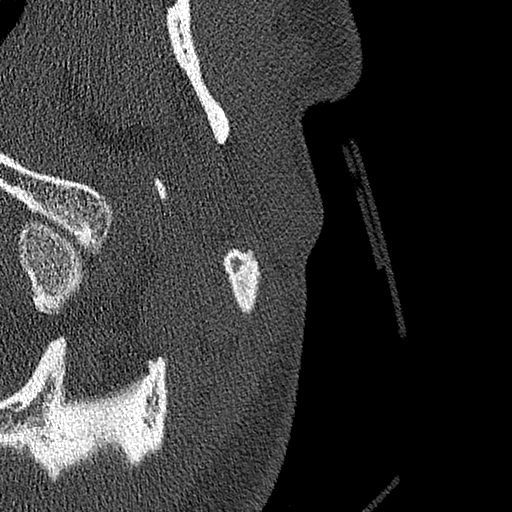
[im 43/172  bone]
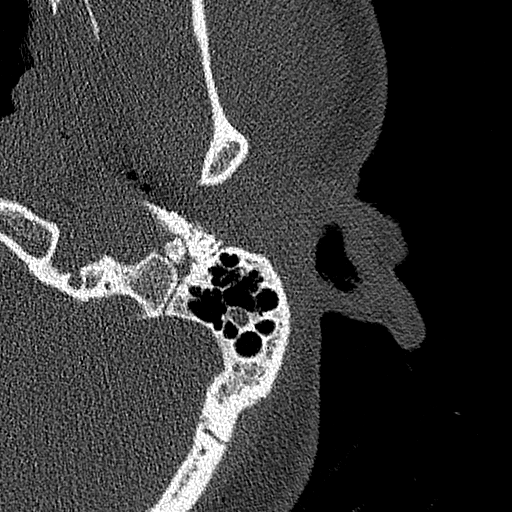
[im 65/172  bone]
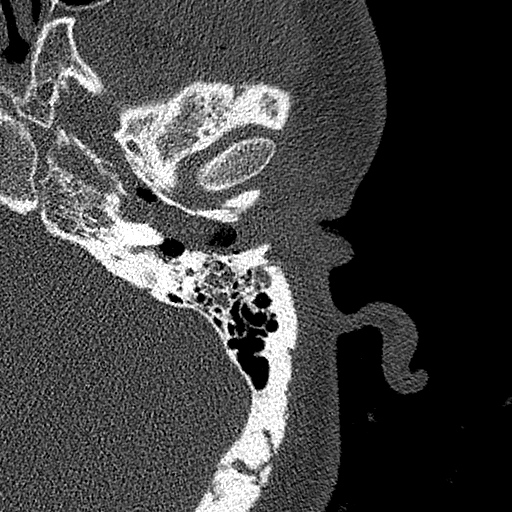
[im 86/172  bone]
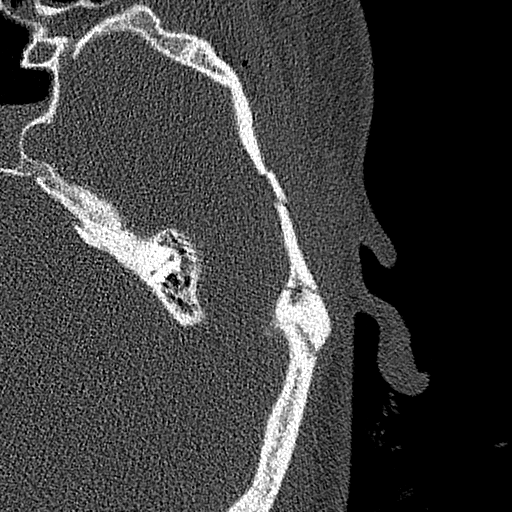
[im 107/172  bone]
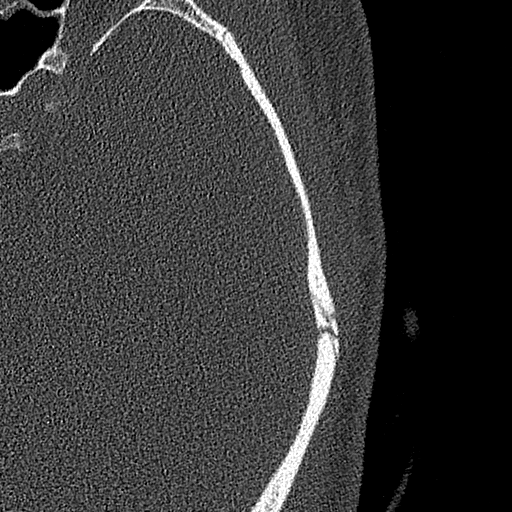
[im 129/172  bone]
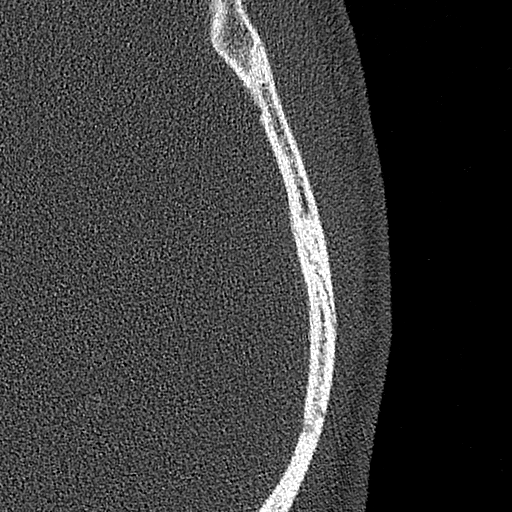
[im 150/172  bone]
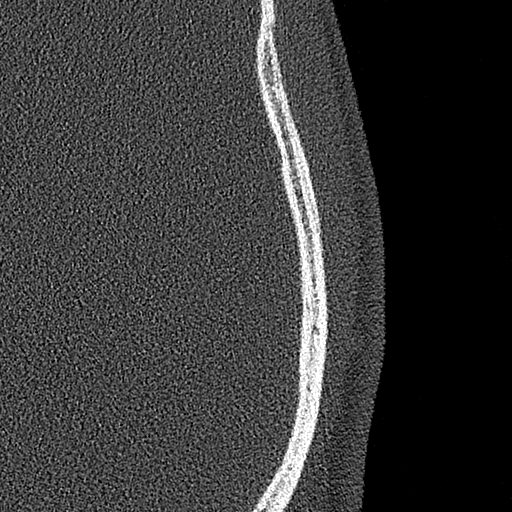

[Series 7: coronals · coronal · 0.25mm/px · 1 of 290 slices shown]
[im 145/290  bone]
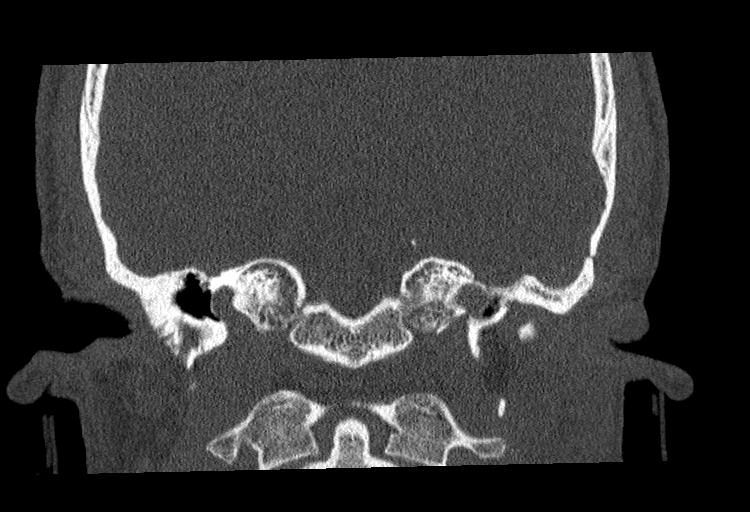

[15 of 40 positions shown; findings below may reference images not displayed]

FINDINGS: Right temporal bone:

External auditory canal and tympanic membrane are unremarkable.
Middle ear cleft and ossicles are unremarkable. Cochlea and
semicircular canals are unremarkable. Tegmen is intact. Facial nerve
demonstrates normal course. Mastoid air cells are clear.

Left temporal bone:

As seen on prior head CT, there is a fracture of the left parietal
calvarium extending into the squamosal and mastoid portions of the
temporal bone. Fracture extends through the posterior and anterior
walls of the external auditory canal, which is partially opacified.
Partial opacification of the mastoid air cells. Tympanic membrane is
not well evaluated. Partial opacification of the middle ear cleft.
Ossicles appear intact. Cochlea and semicircular canals are
unremarkable. Tegmen appears intact. Facial nerve demonstrates
normal course.
IMPRESSION: Fracture involves squamosal and mastoid portions of the left
temporal bone. Otic capsule is spared. There is involvement of
anterior and posterior walls of the external auditory canal. Partial
opacification of mastoid air cells, middle ear, and external
auditory canal, which may reflect a combination of fluid and
hemorrhage.
